# Patient Record
Sex: Female | Born: 1990 | Race: Black or African American | Hispanic: No | Marital: Married | Smoking: Never smoker
Health system: Southern US, Community
[De-identification: ages and names within clinical notes are randomized; demographics above are authoritative.]

## PROBLEM LIST (undated history)

## (undated) ENCOUNTER — Inpatient Hospital Stay (HOSPITAL_COMMUNITY): Payer: Self-pay

## (undated) DIAGNOSIS — R896 Abnormal cytological findings in specimens from other organs, systems and tissues: Principal | ICD-10-CM

## (undated) DIAGNOSIS — R87629 Unspecified abnormal cytological findings in specimens from vagina: Secondary | ICD-10-CM

## (undated) DIAGNOSIS — Z8619 Personal history of other infectious and parasitic diseases: Secondary | ICD-10-CM

## (undated) DIAGNOSIS — M92529 Juvenile osteochondrosis of tibia tubercle, unspecified leg: Secondary | ICD-10-CM

## (undated) DIAGNOSIS — M9250 Juvenile osteochondrosis of tibia and fibula, unspecified leg: Secondary | ICD-10-CM

## (undated) DIAGNOSIS — A749 Chlamydial infection, unspecified: Secondary | ICD-10-CM

## (undated) HISTORY — DX: Chlamydial infection, unspecified: A74.9

## (undated) HISTORY — PX: NO PAST SURGERIES: SHX2092

## (undated) HISTORY — DX: Juvenile osteochondrosis of tibia tubercle, unspecified leg: M92.529

## (undated) HISTORY — DX: Personal history of other infectious and parasitic diseases: Z86.19

## (undated) HISTORY — DX: Abnormal cytological findings in specimens from other organs, systems and tissues: R89.6

## (undated) HISTORY — DX: Juvenile osteochondrosis of tibia and fibula, unspecified leg: M92.50

---
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
## 2009-01-24 ENCOUNTER — Ambulatory Visit: Payer: Self-pay | Admitting: Internal Medicine

## 2009-02-28 ENCOUNTER — Ambulatory Visit: Payer: Self-pay | Admitting: Internal Medicine

## 2009-02-28 DIAGNOSIS — H9209 Otalgia, unspecified ear: Secondary | ICD-10-CM | POA: Insufficient documentation

## 2009-02-28 DIAGNOSIS — M79609 Pain in unspecified limb: Secondary | ICD-10-CM

## 2009-03-02 LAB — CONVERTED CEMR LAB
Chlamydia, DNA Probe: NEGATIVE
GC Probe Amp, Genital: NEGATIVE

## 2009-04-18 ENCOUNTER — Ambulatory Visit: Payer: Self-pay | Admitting: Internal Medicine

## 2009-07-10 ENCOUNTER — Ambulatory Visit: Payer: Self-pay | Admitting: Internal Medicine

## 2009-09-29 ENCOUNTER — Ambulatory Visit: Payer: Self-pay | Admitting: Internal Medicine

## 2009-12-20 ENCOUNTER — Ambulatory Visit: Payer: Self-pay | Admitting: Internal Medicine

## 2009-12-20 LAB — CONVERTED CEMR LAB: Hemoglobin: 13 g/dL

## 2009-12-22 ENCOUNTER — Encounter: Payer: Self-pay | Admitting: *Deleted

## 2009-12-22 LAB — CONVERTED CEMR LAB
Chlamydia, Swab/Urine, PCR: NEGATIVE
GC Probe Amp, Urine: NEGATIVE

## 2009-12-23 DIAGNOSIS — K219 Gastro-esophageal reflux disease without esophagitis: Secondary | ICD-10-CM | POA: Insufficient documentation

## 2009-12-23 DIAGNOSIS — J309 Allergic rhinitis, unspecified: Secondary | ICD-10-CM | POA: Insufficient documentation

## 2010-03-15 ENCOUNTER — Encounter: Payer: Self-pay | Admitting: *Deleted

## 2010-03-16 ENCOUNTER — Ambulatory Visit: Payer: Self-pay | Admitting: Internal Medicine

## 2010-05-23 ENCOUNTER — Emergency Department (HOSPITAL_COMMUNITY): Admission: EM | Admit: 2010-05-23 | Discharge: 2010-05-23 | Payer: Self-pay | Admitting: Emergency Medicine

## 2010-05-25 ENCOUNTER — Encounter: Payer: Self-pay | Admitting: Internal Medicine

## 2010-05-25 ENCOUNTER — Ambulatory Visit: Payer: Self-pay | Admitting: Internal Medicine

## 2010-05-25 ENCOUNTER — Telehealth: Payer: Self-pay | Admitting: Internal Medicine

## 2010-05-25 DIAGNOSIS — R079 Chest pain, unspecified: Secondary | ICD-10-CM

## 2010-05-29 ENCOUNTER — Ambulatory Visit: Payer: Self-pay | Admitting: Internal Medicine

## 2010-06-07 ENCOUNTER — Ambulatory Visit: Payer: Self-pay | Admitting: Internal Medicine

## 2010-07-26 ENCOUNTER — Ambulatory Visit: Payer: Self-pay | Admitting: Internal Medicine

## 2010-07-26 DIAGNOSIS — R109 Unspecified abdominal pain: Secondary | ICD-10-CM | POA: Insufficient documentation

## 2010-07-26 DIAGNOSIS — R197 Diarrhea, unspecified: Secondary | ICD-10-CM

## 2010-07-31 ENCOUNTER — Encounter: Payer: Self-pay | Admitting: Internal Medicine

## 2010-08-01 ENCOUNTER — Encounter: Payer: Self-pay | Admitting: Internal Medicine

## 2010-08-29 ENCOUNTER — Ambulatory Visit
Admission: RE | Admit: 2010-08-29 | Discharge: 2010-08-29 | Payer: Self-pay | Source: Home / Self Care | Attending: Internal Medicine | Admitting: Internal Medicine

## 2010-09-11 ENCOUNTER — Telehealth: Payer: Self-pay | Admitting: Internal Medicine

## 2010-09-11 NOTE — Assessment & Plan Note (Signed)
Summary: CPX/DEPO-INJ/RCD   Vital Signs:  Patient profile:   20 year old female Menstrual status:  Depo Height:      63.75 inches Weight:      124 pounds Pulse rate:   78 / minute BP sitting:   110 / 60  (right arm) Cuff size:   regular  Vitals Entered By: Romualdo Bolk, CMA (AAMA) (Dec 20, 2009 11:00 AM) CC: CPX with pap and Depo   History of Present Illness: Christine Rose comes in comes in today  for  preventive visit . Since  last visit no sig change in health  but concern about  gets achiness  around her heart   for a while . Worse after eating  and laying down sometiimes . NOT related to activity walking, breathing .  no cough or change in exercise tolerance. Risk:  depo doing okNo change partners  or other increase risk . NO GU signs   Preventive Care Screening  Last Tetanus Booster:    Date:  05/22/2006    Results:  Tdap    Preventive Screening-Counseling & Management  Alcohol-Tobacco     Alcohol drinks/day: 0     Smoking Status: never  Caffeine-Diet-Exercise     Caffeine use/day: 1-2     Does Patient Exercise: no  Hep-HIV-STD-Contraception     Sun Exposure-Excessive: no  Safety-Violence-Falls     Seat Belt Use: yes     Smoke Detectors: yes      Sexual History:  currently monogamous.        Drug Use:  never.        Blood Transfusions:  no.    Current Medications (verified): 1)  Depo-Provera 150 Mg/ml Im Susp (Medroxyprogesterone Acetate)  Allergies (verified): No Known Drug Allergies  Past History:  Past medical, surgical, family and social histories (including risk factors) reviewed, and no changes noted (except as noted below).  Past Medical History: NL birth Hx  6 4 oz 37 weeks Chicken pox disease Hx Osgood schlatters Pos chlamydia screen 2 10  Last Pap: 2/10 Tdap: 2007  seasonal allergy  .   Allergic rhinitis  Past History:  Care Management: None Current  Family History: Reviewed history from 06/22/2008 and no changes  required. no blood clots Mom has large ASD   declining  surgical  i ntervention  Social History: Reviewed history from 10/05/2008 and no changes required. non smoker   no alcohol hh of 5    no ets.  little  milk  no exercise .  School  [employer].   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 sleep less than 8 hours during week   .  8 hours Seat Belt Use:  yes Sun Exposure-Excessive:  no Sexual History:  currently monogamous Drug Use:  never Blood Transfusions:  no  Review of Systems  The patient denies anorexia, fever, weight loss, weight gain, vision loss, decreased hearing, hoarseness, syncope, dyspnea on exertion, peripheral edema, prolonged cough, abdominal pain, melena, hematochezia, severe indigestion/heartburn, hematuria, incontinence, genital sores, muscle weakness, transient blindness, difficulty walking, depression, unusual weight change, abnormal bleeding, enlarged lymph nodes, and angioedema.         under eye has black circldes   Physical Exam General Appearance: well developed, well nourished, no acute distress Eyes: conjunctiva and lids normal, PERRLA, EOMI, WNL  allergic shiners seen  Ears, Nose,  Mouth, Throat: TM clear, nares clear, oral exam WNL Neck: supple, no lymphadenopathy, no thyromegaly, no JVD Respiratory: clear to auscultation and percussion, respiratory effort normal Cardiovascular: regular rate and rhythm, S1-S2, no murmur, rub or gallop, no bruits, peripheral pulses normal and symmetric, no cyanosis, clubbing, edema or varicosities Chest: no scars, masses, tenderness; no asymmetry, skin changes, nipple discharge   Gastrointestinal: soft, non-tender; no hepatosplenomegaly, masses; active bowel sounds all quadrants,  Genitourinary: NI Lymphatic: no cervical, axillary or inguinal adenopathy Musculoskeletal: gait normal, muscle tone and strength WNL, no joint swelling, effusions,  discoloration, crepitus  Skin: clear, good turgor, color WNL, no rashes, lesions, or ulcerations Neurologic: normal mental status, normal reflexes, normal strength, sensation, and motion Psychiatric: alert; oriented to person, place and time Other Exam:     Impression & Recommendations:  Problem # 1:  PREVENTIVE HEALTH CARE (ICD-V70.0) Discussed nutrition,exercise,diet,healthy weight, vitamin D and calcium.  Orders: Fingerstick (36416) Hgb (24401)  Problem # 2:  CONTRACEPTIVE MANAGEMENT (ICD-V25.09) continue  Orders: Depo-Provera 150mg  (U2725)  Problem # 3:  GERD (ICD-530.81) chest signs seem like reflux disease  and no evidence of cv pulm process at present  .Marland Kitchen..counseled lifestyle intervention and add meds   declines  medication intervention at this time  Problem # 4:  ALLERGIC RHINITIS (ICD-477.9) adding to fatigue and facial dark circles   . counseled  decline nasal steroid   Complete Medication List: 1)  Depo-provera 150 Mg/ml Im Susp (Medroxyprogesterone acetate)  Other Orders: Admin of Therapeutic Inj  intramuscular or subcutaneous (36644) T-Chlamydia & GC Probe, Urine (87491/[id number])  Patient Instructions: 1)  Next Depo Due Aug 3rd 2)  Small frequent meals  and avoid  caffiene and  laying down after eating to help woth the reflux signs .Can add ranitidine  or pepcid  if not getting better every day for 2 weeks  .    If persistent or  progressive  then call of rov. 3)  For allergy and try OTC loratidine  , zyrtec ( certizine ) or allergra to help with  allergy signs  4)  Better sleep and treating the allergies  will help the dark circles under your eyes also. 5)  You will be informed of lab results when available.    Medication Administration  Injection # 1:    Medication: Depo-Provera 150mg     Diagnosis: CONTRACEPTIVE MANAGEMENT (ICD-V25.09)    Route: IM    Site: RUOQ gluteus    Exp Date: 06/12/2012    Lot #: [id number]    Mfr: greenstone    Patient  tolerated injection without complications    Given by: Romualdo Bolk, CMA (AAMA) (Dec 20, 2009 11:10 AM)  Orders Added: 1)  Depo-Provera 150mg  [J1055] 2)  Admin of Therapeutic Inj  intramuscular or subcutaneous [96372] 3)  Fingerstick [36416] 4)  Hgb [85018] 5)  T-Chlamydia & GC Probe, Urine [87491/[id number]] 6)  Est. Patient 18-39 years [99395] 7)  Est. Patient Level III [99213]   Laboratory Results   CBC   HGB:  13.0 g/dL   (Normal Range: 59.5-63.8 in Males, 12.0-15.0 in Females) CommentsRita Ohara  Dec 20, 2009 12:06 PM

## 2010-09-11 NOTE — Progress Notes (Signed)
Summary: ED Report   Christine Rose, Christine Rose MRN: [id number] Acct#: [id number] PHYSICIAN DOCUMENTATION SHEET Fri Oct 14 08:51:00 EDT 2011 Eligha Bridegroom. [facility] [address] [place], [place] [address] PHONE: [phone] MRN: [id number] Account #: [id number] Name: Christine, Rose Sex: F Age: 20 DOB: 03-06-1991 Complaint: Chest discomfort Primary Diagnosis: Chest pain Arrival Time: 05/23/2010 08:53 Discharge Time: 05/23/2010 14:09 All Providers: Dr. Cheri Guppy, MD PROVIDER: Dr. Cheri Guppy, MD HPI: The patient is a 20 year old female who presents with a chief complaint of chest discomfort. The history was provided by the patient. she c/o intermittent sharp midsternal cp. the pain lasts only a few minutes at a time. she denies cough, fever,chills, n/v/leg pain or swelling. she is on depo. she denies recent trip or surgery. she denies smoking. she denies cp if she goes up stairs at home. she has no pain now. she says her mother had an ami when she was in her 30s. The chest discomfort started yesterday. The onset was insidious. The Pattern is episodic. The patient has had several episode(s). The Course is resolved. The chest discomfort is located in the substernal region. The chest discomfort has no radiation. It is characterized as sharp. The patient's pain is 0 out of 10 now. The condition is aggravated by nothing and not aggravated by coughing, deep breathing, eating, exercise, exertion, lying on back, movement or palpation. The condition is relieved by nothing. There has been no associated abdominal pain, chills, cough, diaphoresis, dizziness, dysphagia, dyspnea, fever, nausea, palpitations or syncope. The patient's relevant risk factors are positive for family history of premature CAD and negative for diabetes, hypertension, recent surgery, recent travel or smoking. The patient has no significant history of serious medical conditions, similar symptoms  previously or multiple prior evals for same. 11:15 05/23/2010 by Cheri Guppy, MD, Dr. Linus Orn: Statement: all systems negative except as marked or noted in the HPI 11:15 05/23/2010 by Cheri Guppy, MD, Dr. [facility]: Documentation: physician reviewed/amended Historian: patient Patient's Current Physicians Patient's Current Physicians (please list PCP first) [facility], - General Internal Medicine Past medical history: none Family History: CAD Surgical History: none 1 Christine, Rose MRN: [id number] Acct#: [id number] Social History: non-smoker, non-drinker, no drug abuse Allergies Drug Reaction Allergy Note NKDA 11:15 05/23/2010 by Cheri Guppy, MD, Dr. Joseph Pierini Medications: Documentation: physician reviewed/amended Medications Medication [Medication] Dosage Frequency Last Dose Denies 11:15 05/23/2010 by Cheri Guppy, MD, Dr. Physical examination: Vital signs and O2 SAT: reviewed Constitutional: well developed, well nourished, in no acute distress Head and Face: normocephalic, atraumatic Eyes: normal appearance, no scleral icterus Neck: full range of motion Cardiovascular: regular rate and rhythm, no murmur, rub, or gallop Respiratory: normal, no rales, rhonchi, wheezes, or rub Chest: nontender Abdomen: nontender, nondistended, soft Extremities: normal, no abnormalities, no deformity, full range of motion, neurovascularly intact, pulses normal, no tenderness, no edema Neuro: AA&Ox3, motor intact in all extremities Skin: color normal, no rash, dry Psychiatric: AA&Ox4, no abnormalities of mood or affect 11:15 05/23/2010 by Cheri Guppy, MD, Dr. ED Course: Comments: H&P performed - intitiated evaluation with xr 11:15 05/23/2010 by Cheri Guppy, MD, Dr. Imaging: Radiology Study Interpretation Comments Timestamp xray(s) per radiologist, reviewed by myself, normal 13:36 05/23/2010 by Cheri Guppy, MD, Dr. Patient  disposition: Patient disposition: Disch - Home 2 [name], [name] - MRN: [id number] Acct#: [id number] Primary Diagnosis: chest pain Counseling: advised of diagnosis, advised of treatment plan, advised of xray and lab findings, advised of need for close follow-up, advised of need  to return for worsening or changing symptoms, advised of specific symptoms that should prompt their return 13:36 05/23/2010 by Cheri Guppy, MD, Dr. Medication disposition: Medications Medication [Medication] Dosage Frequency Last Dose Medication disposition PCP contact Denies continue 13:36 05/23/2010 by Cheri Guppy, MD, Dr. Discharge: Discharge Instructions: *free text Append a Note to Discharge Instructions: Your ecg and chest xray do not show any signs of a heart attack or lung disease. Use ibuprofen or tylenol for pain. Follow up with your doctor for reevaluation if your symptoms persist. Return for worse symptoms. 13:37 05/23/2010 by Cheri Guppy, MD, Dr. Milinda Pointer electronically signed by Responsible Physician 13:38 05/23/2010 by Cheri Guppy, MD, Dr. Milinda Pointer electronically signed by Responsible Physician 13:41 05/23/2010 by Cheri Guppy, MD, Dr. Reviewed result: Result Type: Cleda Daub: [id number] Step Type: XRAY Procedure Name: DG CHEST 2 VIEW Procedure: DG CHEST 2 VIEW Result: Clinical Data: Chest pain. CHEST - 2 VIEW Comparison: None. Findings: Normal sized heart. Clear lungs with normal vascularity. Normal appearing bones. IMPRESSION: 3 [name], [name] MRN: [id number] Acct#: [id number] Normal examination. 13:38 05/23/2010 by Cheri Guppy, MD, Dr. Cardiac: EKG EKG Time Rate Rhythm Axis PQRS & Intervals ST & T 08:58 PM 71 BPM normal sinus, premature atrial contractions normal normal QRS normal 13:41 05/23/2010 by Cheri Guppy, MD, Dr. 4      [place]. [facility] [address] [place], [place]  [address] Emergency Department [phone] Assessment Sheet MR # [id number] Sex: Female DOB: 01-29-1991 Name: Christine, Rose Phone: [phone] Address: [address] [place], [place] [address] Room: RM30 Account # [id number] Age: 59 Complaint: Chest pain Acuity: 3 - Urgent Arrival Date/Time: 08:53 05/23/2010 Insurance: [employer] Arrived by: Sales executive Amb/Helicopter: Accompanied By: Referring Facility: Mobility: EDP / T/S Attending: Weldon Inches, MD, Hassan Buckler Primary Care: [facility] ED T/S: PA/NP/CNM: Gentry Fitz, Vital Signs Inits Time Temp Blood Pressure Pulse Resp IG 09:02 98.5 O 122/83 Automatic,Lying,Left Arm 69 16 JG 09:50 115/70 Automatic,Lying,Right Arm 61 JG 11:49 98.1 107/68 Automatic,Lying,Left Arm 59 TB 14:08 Pulse Oximetry Time % 09:02 100 09:50 100 11:49 100 14:08 [place] Coma Time Score Complaint Code: Chest Pain Resident / T/S Montez Hageman. Faculty: Jerrye Bushy Pregnant: Private PA/NP: Gentry Fitz, Triage Nurse: Chilton Si - RN, Marylu Lund Consultant on Call: [facility] on Call: Weights: Treatment PTA: Disposition Information Primary Diagnosis: Chest pain Other Diagnoses: Disposition: Disch - Home Family Notification: Report Called By: Report Given To: Prescriptions: Discharge Instructions: *free text -- {Documentation truncated, see the full med record.} Disability Statement: Follow-up Care: Appt Date/Time: Discharge Time: 14:09 05/23/2010 Discharge Nurse: Jaci Lazier - RN, Traci Discharge Nurse: ____________________________________________ PMH (Most Recent) - 05/23/2010 11:16 - Cheri Guppy, MD, Dr. Past Medical Hx: None Immunization Hx: Social History: No drug abuse, Non-drinker, Non-smoker Family History: CAD LMP: Other Menstual Hx: OB/GYN History: Special Needs: Note: Pupils Time L(mm) R(mm) Pain Time Scale 08:59 5/10 14:09 0/10 Medications Medication Dosage Freq Medication Dosage  Freq Denies Allergy Allergy Allergic Reaction NKDA  Triage/Initial Assessment 08:59 05/23/2010 - Initial Triage Info -- Dorice Lamas - RN Quick Assessment: AIRWAY handling secretions, AIRWAY intact, AMBULATES - without assistance, BEHAVIOR tearful, BEHAVIOR cooperative, BREATH SOUNDS - clear & equal Isolation Assessment: Assmt completed-no isolation needed Chief Complaint: Chest pain Complaint Category: Chest Pain Initial Triage Acuity: 3 - Urgent Presenting Complaints: Chest Pain Note: pt here with chest pain. pt has no previous hx of chest pain. pt tearful in triage. pt rates pain 5/10 XRAY 13:35 05/23/2010 - Final Order Results -- Interface OBSERVATION  DATE/TIME: 05/23/2010 13:26 Note: Procedure: DG CHEST 2 VIEW Result: Clinical Data: Chest pain. CHEST - 2 VIEW Comparison: None. Findings: Normal sized heart. Clear lungs with normal vascularity. Normal appearing bones. IMPRESSION: Normal examination. Assessment 08:54 05/23/2010 - Change Room -- Andrew Au - REG Change Room: Triage Waiting Area 08:54 05/23/2010 - Change Physician -- Andrew Au - REG EDP / T/S Attending: Gentry Fitz, - Prim. Care Provider: [facility], - Resident / T/S Montez Hageman. Faculty: Gentry Fitz, - ED PA/NP: Gentry Fitz, - Private: PA/NP/CNM: Gentry Fitz - Responsible Physician: Gentry Fitz, - 08:56 05/23/2010 - Change Room -- Dorice Lamas - RN Change Room: EKG Room 08:58 05/23/2010 - Fall Risk Assessment -- Dorice Lamas - RN Low fall risk because: Ambulatory, steady gait, Independent and continent, No hx of falls, No orthostasis 08:59 05/23/2010 - Home Medications -- Dorice Lamas - RN Med: Denies 08:59 05/23/2010 - Pain -- Dorice Lamas - RN Pain Severity: 5/10 Pain Type: Constant, Chest pain Location: chest, diffuse Objective pain assessment: Grimacing, Crying Pain Goal: 0/10 09:00 05/23/2010 - Allergy Information -- Dorice Lamas - RN Allergy: NKDA 09:01 05/23/2010 - Change Room -- Dorice Lamas - RN Change Room: Exam Room 30 09:02 05/23/2010 - Vital Signs -- Janet Berlin - NT Temp: 98.5 Oral BP: 122/83, Automatic, Lying, Left Arm, Regular cuff HR: 69, Regular Resp: 16, N/A Vital Signs Reporting: Normal VS / Non Critical Abnormal VS 09:02 05/23/2010 - Oximetry -- Janet Berlin - NT Pulse Oximetry %: 100 Oxygen Therapy: Room Air 09:06 05/23/2010 - Change Physician -- Cheri Guppy, MD, Dr. Francene Castle / T/S Attending: Weldon Inches, MD, Hassan Buckler - Prim. Care Provider: [facility], - Resident / T/S Montez Hageman. Faculty: Gentry Fitz, - ED PA/NP: Gentry Fitz, - Private: PA/NP/CNM: Gentry Fitz - Responsible Physician: Weldon Inches, MD, Hassan Buckler - 09:39 05/23/2010 - Property Inventory -- Clayburn Pert - RN Valuables Disposition: Remains with patient (list all items) 09:41 05/23/2010 - Primary Survey -- Clayburn Pert - RN Airway: Intact Breathing: Present Circulation: Adequate Breath Sounds - Left: Breath sounds - Clear Breath Sounds - Right: Breath sounds - Clear RUE Strength: Normal RLE Strength: Normal LUE Strength: Normal LLE Strength: Normal Presenting Complaints: Chest Pain (non-cardiac) 09:50 05/23/2010 - Musculoskeletal/Extremity -- Clayburn Pert - RN Historian: patient Symptom: Bone pain Note: Pt here with c/o sharp center chest pain that started last night. Pt reports that is intermittent andn is pain free at this time. No n/v, no sob, no diaphoresis. 09:50 05/23/2010 - Pt. Safety/Nursing Rounds -- Clayburn Pert - RN Indications: Nursing rounds Education/Consent: Barriers to learning assessed Process/Procedure: Bed rails up, Call light within reach, Comfort measures, Introduced self to patient/family, Stretcher locked in lowest position Post procedure assessment: Alert & oriented x 3, Patient comfortable, Patient resting, Skin warm and dry, Vital signs WNL Performed by: Pernell Dupre - RN, Erin 09:50 05/23/2010 - Oximetry -- Ottis Stain - NT Pulse Oximetry %: 100 Oxygen Therapy:  Room Air 09:50 05/23/2010 - Vital Signs -- Ottis Stain - NT Temp: BP: 115/70, Automatic, Lying, Right Arm, Regular cuff HR: 61 Vital Signs Reporting: Normal VS / Non Critical Abnormal VS 11:16 05/23/2010 - HPI -- Cheri Guppy, MD, Dr. Note: The patient is a 20 year old female who presents with a chief complaint of chest discomfort. The history was provided by the patient. she c/o intermittent sharp midsternal cp. the pain lasts only a few minutes at a time. she denies cough, fever,chills, n/v/leg pain or swelling. she is on depo. she denies recent trip or surgery. she denies smoking. she denies cp if she  goes up stairs at home. she has no pain now. she says her mother had an ami when she was in her 30s. The chest discomfort started yesterday. The onset was insidious. The Pattern is episodic. The patient has had several episode(s). The Course is resolved. The chest discomfort is located in the substernal region. The chest discomfort has no radiation. It is characterized as sharp. The patient's pain is 0 out of 10 now. The condition is aggravated by nothing and not aggravated by coughing, deep breathing, eating, exercise, exertion, lying on back, movement or palpation. The condition is relieved by nothing. There has been no associated abdominal pain, chills, cough, diaphoresis, dizziness, dysphagia, dyspnea, fever, nausea, palpitations or syncope. The patient's relevant risk factors are positive for family history of premature CAD and negative for diabetes, hypertension, recent surgery, recent travel or smoking. The patient has no significant history of serious medical conditions, similar symptoms previously or multiple prior evals for same. 11:16 05/23/2010 - Allergy Information -- Cheri Guppy, MD, Dr. Titus Mould: NKDA 11:16 05/23/2010 - Home Medications -- Cheri Guppy, MD, Dr. Med: Denies 11:16 05/23/2010 - Physical Examination -- Cheri Guppy, MD, Dr. Sheliah Hatch EDIS:  assessment_sheet Page 3 of 3 Friday - May 25, 2010 - 08:51 [facility] [phone] Assessment Sheet MR # [id number] Sex: Female DOB: 1991-08-02 Name: Ladajah, Soltys Phone: [phone] Address: [address] [place], [place] [address] Room: RM30 Account # [id number] Age: 37 [place]. [facility] [address] [place], [place] [address] Vital signs and O2 SAT: Reviewed Constitutional: Well developed, Well nourished, In no acute distress Head and Face: Normocephalic, Atraumatic Eyes: Normal appearance, No scleral icterus Neck: Full range of motion Cardiovascular: Regular rate and rhythm, No murmur, rub, or gallop Respiratory: Normal, No rales, rhonchi, wheezes, or rub Chest: Nontender Abdomen: Nontender, Nondistended, Soft Extremities: Normal, No abnormalities, No deformity, Full range of motion, Neurovascularly intact, Pulses normal, No tenderness, No edema Neuro: AA&Ox3, Motor intact in all extremities Skin: Color normal, No rash, Dry Psychiatric: AA&Ox4, No abnormalities of mood or affect 11:49 05/23/2010 - Vital Signs -- Ottis Stain - NT Temp: 98.1 BP: 107/68, Automatic, Lying, Left Arm, Regular cuff HR: 59 Vital Signs Reporting: Normal VS / Non Critical Abnormal VS 11:49 05/23/2010 - Oximetry -- Ottis Stain - NT Pulse Oximetry %: 100 Oxygen Therapy: Room Air 13:14 05/23/2010 - Hand Off / Margo Aye Pass Info -- Lance Muss - Sec Code Status: FullCode Isolation: None Diet: NPO Safety Risk(s): Cooperative Nurse Contact Number: erin [phone] Monitor Status: May transport without monitor 13:14 05/23/2010 - Group Orders -- Lance Muss - Sec Completed Group Orders Step: Ready - Transport to Xray Completed Group Orders Type: XRAY Completed Grouped Orders: Chest PA & Lat - Ready - Transport to Xray 13:36 05/23/2010 - Discharge Diagnosis -- Cheri Guppy, MD, Dr. Valera Castle: Chest pain 13:36 05/23/2010 - Home Medications --  Cheri Guppy, MD, Dr. Med: Denies Disposition: Continue 14:08 05/23/2010 - Vital Signs -- Dahlia Byes - RN Temp: Vital Signs Reporting: Normal VS / Non Critical Abnormal VS 14:09 05/23/2010 - CDR Request -- Interface Chart sent to CDR: PDF sent to CDR2 14:09 05/23/2010 - Discharge Condition -- Dahlia Byes - RN Acuity: 3 - Urgent Patient Teaching: F/U plan of care reviewed w/ pt/family, Pt voiced understanding of plan of care, Written dx instructions reviewed. signed Pain Severity: 0/10 14:09 05/23/2010 - Screening -- Dahlia Byes - RN [facility]. being hurt at [facility]: No Barriers to learning: No Chg. in ability to  care for self: No Cult/Rel beliefs we need to know: No Current relationship-feel unsafe: No Feel physical threat from others: No Fall/Safety-Risk to Self/Others: No Lives alone: No Requires assistance with bathing: No Requires assistance with eating: No Requires assistance with meds: No Requires assistance w/ mobility: No Unintentional wt loss past 3 mon: No 14:09 05/23/2010 - CDR Request -- Interface Chart sent to CDR: Chart sent to CDR: Wed Oct 12 14:09:39 EDT 2011 Result: OK 08:49 05/25/2010 - Return to work/school -- Sheralyn Boatman -RN Ambrose Mantle In ED From: 05/23/2010 08:53 Until: 05/23/2010 14:09 Return dispo: May return to work without restrictions Return Date: 05/26/2010 08:50 05/25/2010 - CDR Request -- Interface Chart sent to CDR: PDF sent to CDR2 Orders 08:54 05/23/2010 - Fall Risk Assessment -- Andrew Au - REG 08:54 05/23/2010 - Property Inventory -- Andrew Au - REG 08:54 05/23/2010 - Initial Patient Orders -- Andrew Au - REG 09:36 05/23/2010 - ECG -- Dorice Lamas - RN 09:51 05/23/2010 - Chest PA & Lat -- Cheri Guppy, MD, Dr. 09:51 05/23/2010 - Rhythm Strip, Monitor -- Cheri Guppy, MD, Dr. 16:10 05/23/2010 - Check on Imaging Delay -- Cheri Guppy, MD, Dr. Allean Found 05/23/2010 - Check on Imaging Delay -- Cheri Guppy, MD, Dr. 13:36  05/23/2010 - Disch - Home -- Cheri Guppy, MD, Dr. Review Charges - Dahlia Byes - RN at 05/23/2010 14:09

## 2010-09-11 NOTE — Assessment & Plan Note (Signed)
Summary: depo shot//ccm  Nurse Visit   Vital Signs:  Patient profile:   19 year old female Menstrual status:  Depo Weight:      121 pounds Pulse rate:   60 / minute BP sitting:   100 / 60  (right arm) Cuff size:   regular  Vitals Entered By: Romualdo Bolk, CMA (AAMA) (September 29, 2009 8:40 AM) CC: Pt states no side effects or problems on depo.   Allergies: No Known Drug Allergies   Patient Instructions: 1)  Next Depo Due May 12 with CPX   Medication Administration  Injection # 1:    Medication: Depo-Provera 150mg     Diagnosis: CONTRACEPTIVE MANAGEMENT (ICD-V25.09)    Route: IM    Site: RUOQ gluteus    Exp Date: 12/11/2011    Lot #: [id number]    Mfr: [email or web address]    Patient tolerated injection without complications    Given by: Romualdo Bolk, CMA (AAMA) (September 29, 2009 8:39 AM)  Orders Added: 1)  Depo-Provera 150mg  [J1055] 2)  Admin of Therapeutic Inj  intramuscular or subcutaneous [95621]

## 2010-09-11 NOTE — Letter (Signed)
Summary: Out of Work  Adult nurse at [employer]  [address]   [place], [place] [address]   Phone: [phone]  Fax: [phone]    May 29, 2010   Employee:  Christine Rose    To Whom It May Concern:   For Medical reasons, please excuse the above named employee from work for the following dates:  Start:   October 17th  End:   October 24 th   If you need additional information, please feel free to contact our office.         Sincerely,    Madelin Headings MD

## 2010-09-11 NOTE — Assessment & Plan Note (Signed)
Summary: depo inj/njr  Nurse Visit   Vital Signs:  Patient profile:   20 year old female Menstrual status:  Depo Weight:      125 pounds Pulse rate:   72 / minute BP sitting:   100 / 62  (left arm) Cuff size:   regular  Vitals Entered By: Romualdo Bolk, CMA (AAMA) (June 07, 2010 11:09 AM) CC: Depo Inj- No side effects, no period, ha's, wt gain or spotting.    Patient Instructions: 1)  Next Depo due 08/29/2010   Current Medications (verified): 1)  Depo-Provera 150 Mg/ml Im Susp (Medroxyprogesterone Acetate)  Allergies (verified): No Known Drug Allergies  Medication Administration  Injection # 1:    Medication: Depo-Provera 150mg     Diagnosis: CONTRACEPTIVE MANAGEMENT (ICD-V25.09)    Route: IM    Site: RUOQ gluteus    Exp Date: 07/12/2012    Lot #: [id number]    Mfr: [email or web address]    Patient tolerated injection without complications    Given by: Romualdo Bolk, CMA (AAMA) (June 07, 2010 11:11 AM)  Orders Added: 1)  Depo-Provera 150mg  [J1055] 2)  Admin of Therapeutic Inj  intramuscular or subcutaneous [65784]

## 2010-09-11 NOTE — Assessment & Plan Note (Signed)
Summary: fu per pt/njr   Vital Signs:  Patient profile:   20 year old female Menstrual status:  Depo Height:      63.75 inches (161.93 cm) Weight:      120 pounds (54.55 kg) O2 Sat:      98 % on Room air Temp:     98.5 degrees F (36.94 degrees C) oral Pulse rate:   100 / minute BP sitting:   102 / 70  (left arm) Cuff size:   regular  Vitals Entered By: Josph Macho RMA (May 29, 2010 9:55 AM)  O2 Flow:  Room air CC: Follow-up visit on chest pain- pt states she feels better/ CF   History of Present Illness: Christine Rose comes in today   for above.  wit mom today.  SInce her last visit her chest pain is better but there at times with some motions.  poss reated to stress.  never had rash and didnt takt  medication f or shingles.  No sob or fever.    No new signs   Preventive Screening-Counseling & Management  Alcohol-Tobacco     Alcohol drinks/day: 0     Smoking Status: never  Caffeine-Diet-Exercise     Caffeine use/day: 1-2     Does Patient Exercise: no  Current Medications (verified): 1)  Depo-Provera 150 Mg/ml Im Susp (Medroxyprogesterone Acetate)  Allergies (verified): No Known Drug Allergies  Past History:  Past medical, surgical, family and social histories (including risk factors) reviewed, and no changes noted (except as noted below).  Past Medical History: Reviewed history from 12/20/2009 and no changes required. NL birth Hx  6 4 oz 37 weeks Chicken pox disease Hx O[facility] schlat[facility] Pos chlamydia screen 2 10  Last Pap: 2/10 Tdap: 2007  seasonal allergy  .   Allergic rhinitis  Family History: Reviewed history from 12/20/2009 and no changes required. no blood clots Mom has large ASD   declining  surgical  i ntervention  Social History: Reviewed history from 12/20/2009 and no changes required. non smoker   no alcohol hh of 5    no ets.  little  milk  no exercise .  School  [employer].   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 sleep less than 8 hours during week   .  8 hours   Review of Systems  The patient denies anorexia, fever, weight loss, weight gain, vision loss, decreased hearing, hoarseness, syncope, peripheral edema, prolonged cough, abdominal pain, melena, hematochezia, severe indigestion/heartburn, abnormal bleeding, and enlarged lymph nodes.         tends to be quiet and hold stress in   Physical Exam  General:  Well-developed,well-nourished,in no acute distress; alert,appropriate and cooperative throughout examination looks more rested  Head:  normocephalic and atraumatic.   Neck:  No deformities, masses, or tenderness noted. Chest Wall:  slight tendernes  later to sternum on left   no creitus  Breasts:  norash Lungs:  Normal respiratory effort, chest expands symmetrically. Lungs are clear to auscultation, no crackles or wheezes. Heart:  Normal rate and regular rhythm. S1 and S2 normal without gallop, murmur, click, rub or other extra sounds. Pulses:  pulses intact without delay   Neurologic:  non focal Skin:  no rashes, no ecchymoses, and no petechiae.   Cervical Nodes:  No lymphadenopathy noted Psych:  Oriented X3, normally interactive, good  eye contact, not anxious appearing, and not depressed appearing.  quiet more relaxed   Impression & Recommendations:  Problem # 1:  CHEST PAIN, ACUTE (ICD-786.50) Assessment Improved probably chest wall  poss job related   and stress anxiety over this   Problem # 2:  OTHER PSYCHOLOGICAL OR PHYSICAL STRESS NEC OTHER (ICD-V62.89) disc  and good supports   Complete Medication List: 1)  Depo-provera 150 Mg/ml Im Susp (Medroxyprogesterone acetate)  Patient Instructions: 1)  ok to go back to work but  and increase activity  .. still could have pain with certain movements .     note for work and Expectant management  2)  follow up at check up or next edepo    Orders Added: 1)  Est. Patient  Level III [91478]

## 2010-09-11 NOTE — Assessment & Plan Note (Signed)
Summary: pts been having pains around chest for approx/week/cjr---PT R...   Vital Signs:  Patient profile:   20 year old female Menstrual status:  Depo Height:      63.75 inches Weight:      123 pounds BMI:     21.36 O2 Sat:      99 % Temp:     99.2 degrees F oral Pulse rate:   66 / minute BP sitting:   100 / 60  (right arm) Cuff size:   regular  Vitals Entered By: Romualdo Bolk, CMA (AAMA) (May 25, 2010 11:17 AM) CC: Pt is having chest pains that started on 10/11. Pain are in the center of chest. Pt is did have one time where she was short of breathe. Rt arm hurts then she would have pain in the center of her stomach. Pt states that it goes from her chest down. No burning in her throat.   History of Present Illness: Christine Rose comes in today  for acute onset  of med sharp chest pain when laying down to go to sleep  2 days ago.  recurred at work  and was taken to  [facility] and had evaluation  including EKG and chest x ray , October 12 and  saw no  signs of Heart attack.  NO meds. Since then   continual and waxes and wanes   doesnt wake from sleep.   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    Worse with soda  and "not going down. "   Perosn at work said her Bp was 170 and pulse  127. She comes in today for evaluation.  No sob except when pain inc no cough fever  NVD and no trauma. NO hx of same  . no estrogen on depo and doing ok.    Preventive Screening-Counseling & Management  Alcohol-Tobacco     Alcohol drinks/day: 0     Smoking Status: never  Caffeine-Diet-Exercise     Caffeine use/day: 1-2     Does Patient Exercise: no  Hep-HIV-STD-Contraception     Sun Exposure-Excessive: no  Safety-Violence-Falls     Seat Belt Use: yes     Smoke Detectors: yes  Current Medications (verified): 1)  Depo-Provera 150 Mg/ml Im Susp (Medroxyprogesterone Acetate)  Allergies (verified): No Known  Drug Allergies  Past History:  Past medical, surgical, family and social histories (including risk factors) reviewed, and no changes noted (except as noted below).  Past Medical History: Reviewed history from 12/20/2009 and no changes required. NL birth Hx  6 4 oz 37 weeks Chicken pox disease Hx [facility] schlatters Pos chlamydia screen 2 10  Last Pap: 2/10 Tdap: 2007  seasonal allergy  .   Allergic rhinitis  Past History:  Care Management: None Current  Family History: Reviewed history from 12/20/2009 and no changes required. no blood clots Mom has large ASD   declining  surgical  i ntervention  Social History: Reviewed history from 12/20/2009 and no changes required. non smoker   no alcohol hh of 5    no ets.  little  milk  no exercise .  School  [employer].   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 sleep less than 8 hours during week   .  8 hours   Review of Systems       The patient complains of chest pain.  The patient denies anorexia, fever, weight loss, weight gain, vision loss, decreased hearing, hoarseness, syncope, dyspnea on exertion, melena, hematochezia, severe indigestion/heartburn, hematuria, abnormal bleeding, enlarged lymph nodes, and angioedema.    Physical Exam  General:  alert, well-developed, and well-nourished.   quiet in nad  Head:  normocephalic and atraumatic.   Eyes:  vision grossly intact, pupils equal, and pupils round.   Neck:  No deformities, masses, or tenderness noted. Chest Wall:  tender near sternum at  [place] area   no masses  area near the pink area  Breasts:  No mass, nodules, thickening, tenderness, bulging, retraction, inflamation, nipple discharge or skin changes noted.   Lungs:  normal respiratory effort, no intercostal retractions, no accessory muscle use, normal breath sounds, no dullness, and no fremitus.   Heart:  normal rate, regular rhythm, no murmur, no  gallop, no rub, no JVD, and no lifts.   Abdomen:  Bowel sounds positive,abdomen soft and non-tender without masses, organomegaly or hernias noted. pierced  Msk:  no joint swelling, no joint warmth, and no redness over joints.   Pulses:  pulses intact without delay   Extremities:  no clubbing cyanosis or edema  Neurologic:  non focal  Skin:  no petechiae and no purpura. round 2-3 cm  red area left parasternal  with 2 tiny papules   not vesicles there  some tenderness back ? faint  red left upper back  Cervical Nodes:  No lymphadenopathy noted Psych:  Oriented X3 and good eye contact.  calm flattened affect.  some anxiety   tearfulat times because of pain.  EKG reviewed .  Ed report reviewed   Impression & Recommendations:  Problem # 1:  CHEST PAIN, ACUTE (ICD-786.50)  continual with wax and wane for 3 days     Not vascular  consider  atypical esophageal or possible  early shingles     add  prilosec  or ranitidine .   also rx for  valtrex  if rash  increasing  as discussed  Severity of the pain ispretty severe . she hasnt taken any meds fo rhtis as doesnt take much meds.  ok to take tylenol or even advilif on the omeprezole .    supposed to go out of town for church issues with mom    .  Unsure if had varicella or immunization.   noter for work return 18th or 19th  or when better .    Orders: EKG w/ Interpretation (93000)  Complete Medication List: 1)  Depo-provera 150 Mg/ml Im Susp (Medroxyprogesterone acetate) 2)  Valacyclovir Hcl 1 Gm Tabs (Valacyclovir hcl) .Marland Kitchen.. 1 by mouth three times a day for 7 days  Patient Instructions: 1)  this does not appear to be your heart .  2)  Suspect  poss esophagus problem or even early shingles . 3)  Rec  begin omeprazole 20 mg every day ( prilosec OTC )   or ranittidine 150 mg two times a day  4)  If the red area progressing then begin the vlatrex also incase this is shingles. 5)  return office visit next week or as needed.   Prescriptions: VALACYCLOVIR HCL 1 GM TABS (VALACYCLOVIR HCL) 1 by mouth three times a day for 7 days  #21 x 0   Entered and Authorized by:   Madelin Headings MD   Signed by:   Neta Mends  Panosh MD on 05/25/2010   Method used:   Print then Give to Patient   RxID:   [id number]

## 2010-09-11 NOTE — Letter (Signed)
Summary: Generic Letter  [place] at [facility]  [address] [place], [place] [address]   Phone: [phone]  Fax: [phone]    03/15/2010  Christine Rose [address] [place], [place]  [address]  Dear Christine Rose,  We were just letting you know that you missed you appointment on August 3rd for a injection. If you are going to continue to get these injections, please call our office to reschedule this appointment. Otherwise please call us to let us know that you are no longer planning on getting these injections or are getting them elsewhere. Our number is [phone].         Sincerely,   Tor Netters, CMA (AAMA)

## 2010-09-11 NOTE — Letter (Signed)
Summary: Generic Letter  [place] at [facility]  [address] [place], [place] [address]   Phone: [phone]  Fax: [phone]    12/22/2009  Edrick Kins [address] [place], [place]  [address]  Dear Ms. Laural Benes,  Your test results were normal. If you have any questions, please call our office at [phone].         Sincerely,   Tor Netters, CMA (AAMA)

## 2010-09-11 NOTE — Letter (Signed)
Summary: Out of Work  Adult nurse at [facility] [employer]  [address]   [place], [place] [address]   Phone: [phone]  Fax: [phone]    May 29, 2010   Employee:  Larwance Sachs    To Whom It May Concern:   For Medical reasons, please excuse the above named employee from work for the following dates:  Start:    End:    If you need additional information, please feel free to contact our office.         Sincerely,    Madelin Headings MD

## 2010-09-11 NOTE — Assessment & Plan Note (Signed)
Summary: DEPO INJ // RS  Nurse Visit   Vital Signs:  Patient profile:   20 year old female Menstrual status:  Depo Weight:      128 pounds Pulse rate:   66 / minute BP sitting:   100 / 60  (left arm) Cuff size:   regular  Vitals Entered By: Romualdo Bolk, CMA (AAMA) (March 16, 2010 3:32 PM) CC: Pt is not having any side effects from Depo.    Current Medications (verified): 1)  Depo-Provera 150 Mg/ml Im Susp (Medroxyprogesterone Acetate)  Allergies (verified): No Known Drug Allergies  Patient Instructions: 1)  Next Depo Due 10/27   Medication Administration  Injection # 1:    Medication: Depo-Provera 150mg     Diagnosis: CONTRACEPTIVE MANAGEMENT (ICD-V25.09)    Route: IM    Site: RUOQ gluteus    Exp Date: 10/10/2012    Lot #: [id number]    Mfr: [email or web address]    Patient tolerated injection without complications    Given by: Romualdo Bolk, CMA (AAMA) (March 16, 2010 3:35 PM)  Orders Added: 1)  Depo-Provera 150mg  [J1055] 2)  Admin of Therapeutic Inj  intramuscular or subcutaneous [04540]

## 2010-09-13 NOTE — Assessment & Plan Note (Signed)
Summary: Stomach issues/cb   Vital Signs:  Patient profile:   20 year old female Menstrual status:  Depo Weight:      123 pounds Temp:     99.2 degrees F oral Pulse rate:   78 / minute BP sitting:   120 / 84  (left arm) Cuff size:   regular  Vitals Entered By: Romualdo Bolk, CMA (AAMA) (July 26, 2010 10:27 AM) CC: Sharp stomach pains on top of stomach and both sides x 2 weeks, some nausea but no vomiting. Pt has had loose stools and fever.   History of Present Illness: Christine Rose comesin  for  acute visit  because ofillness that started December 1st  and worse over the 10th.    Diarrhea  bad on Dec 11th.  almost every 1-2 hours " looked like mushed up food.  "   No vomiting but nausea . Feverish    but   no documentdr fever.   then  took    pepto bismol x 2  No helps. Stooling  decrease since then but still has pain  cramps  mostly left side .   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  No one sick at home.    Has missed work since monday dec 11   has other ? about flushed and reg labs     Preventive Screening-Counseling & Management  Alcohol-Tobacco     Alcohol drinks/day: 0     Smoking Status: never  Caffeine-Diet-Exercise     Caffeine use/day: 1-2     Does Patient Exercise: no  Current Medications (verified): 1)  Depo-Provera 150 Mg/ml Im Susp (Medroxyprogesterone Acetate)  Allergies (verified): No Known Drug Allergies  Past History:  Past medical, surgical, family and social histories (including risk factors) reviewed, and no changes noted (except as noted below).  Past Medical History: Reviewed history from 12/20/2009 and no changes required. NL birth Hx  6 4 oz 37 weeks Chicken pox disease Hx Osgood schlatters Pos chlamydia screen 2 10  Last Pap: 2/10 Tdap: 2007  seasonal allergy  .   Allergic rhinitis  Past History:  Care Management: None Current  Family History: Reviewed history from 12/20/2009  and no changes required. no blood clots Mom has large ASD   declining  surgical  i ntervention  Social History: Reviewed history from 12/20/2009 and no changes required. non smoker   no alcohol hh of 5    no ets.  little  milk  no exercise .  School  [employer].   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 sleep less than 8 hours during week   .  8 hours   Review of Systems       The patient complains of anorexia and fever.  The patient denies weight loss, chest pain, dyspnea on exertion, hemoptysis, melena, hematochezia, severe indigestion/heartburn, hematuria, incontinence, genital sores, muscle weakness, unusual weight change, enlarged lymph nodes, and angioedema.    Physical Exam  General:  Well-developed,well-nourished,in no acute distress; alert,appropriate and cooperative throughout examination her with young child Head:  normocephalic and atraumatic.   Eyes:  vision grossly intact, pupils equal, and pupils round.   Ears:  R ear normal and L ear normal.   Mouth:  pharynx pink and moist.   Neck:  No deformities, masses, or  tenderness noted. Lungs:  Normal respiratory effort, chest expands symmetrically. Lungs are clear to auscultation, no crackles or wheezes.no dullness.   Heart:  Normal rate and regular rhythm. S1 and S2 normal without gallop, murmur, click, rub or other extra sounds.no lifts.   Abdomen:  soft, no distention, no masses, no hepatomegaly, and no splenomegaly.  mild tenderness left lateral abdomen  no g or r ebound  Pulses:  pulses intact without delay   Extremities:  no clubbing cyanosis or edema  Neurologic:  non focal  Skin:  turgor normal, color normal, no ecchymoses, and no petechiae.   Cervical Nodes:  No lymphadenopathy noted Psych:  Oriented X3, good eye contact, not anxious appearing, and not depressed appearing.     Impression & Recommendations:  Problem # 1:  DIARRHEA OF PRESUMED  INFECTIOUS ORIGIN (ICD-009.3) recovering but  still with signs  would get stool samples and r/o c diff    otherwise   supportive care .    Orders: T-Stool Giardia / Crypto- EIA ([address]) T-Culture, Stool (87045/87046-70140) T-C diff by PCR (60454) T-Fecal Lactoferrin (70400) Specimen Handling (99000) Venipuncture (09811)  Problem # 2:  ABDOMINAL CRAMPS (ICD-789.00) left abd  seeems related tot above.  Orders: T-Stool Giardia / Crypto- EIA (91478) T-Culture, Stool (87045/[id number]) T-C diff by PCR (29562) T-Fecal Lactoferrin (70400) Specimen Handling (99000) Venipuncture (13086)  Problem # 3:  OTHER PSYCHOLOGICAL OR PHYSICAL STRESS NEC OTHER (ICD-V62.89) Assessment: Improved chests pain better  would have her make ov with appt in 2012 to disc flushes if still present and lab if approrpiate  had last check up in may 2011  Complete Medication List: 1)  Depo-provera 150 Mg/ml Im Susp (Medroxyprogesterone acetate)  Patient Instructions: 1)  prsume this is bowel infection   getting nbetter  2)  Eat light until better expect continued improvement inthe next week. 3)  collect stool tests    to R/O  c diff etc.   4)  will call results  5)  ROv in not better in another 2 weeks or  as needed.    Orders Added: 1)  T-Stool Giardia / Crypto- EIA [57846] 2)  T-Culture, Stool [87045/[id number]] 3)  T-C diff by PCR [81755] 4)  T-Fecal Lactoferrin [70400] 5)  Specimen Handling [99000] 6)  Venipuncture [96295] 7)  Est. Patient Level IV [28413]

## 2010-09-13 NOTE — Assessment & Plan Note (Signed)
Summary: DEPO INJ//CCM  Nurse Visit   Vital Signs:  Patient profile:   20 year old female Menstrual status:  Depo Weight:      120 pounds Pulse rate:   66 / minute BP sitting:   100 / 60  Vitals Entered By: Romualdo Bolk, CMA (AAMA) (August 29, 2010 10:55 AM) CC: Depo inj- No side effects from medication   Patient Instructions: 1)  Next Depo due April 11,2012 2)  CPX with pap due on or after May 11,2012   Allergies: No Known Drug Allergies  Medication Administration  Injection # 1:    Medication: Depo-Provera 150mg     Diagnosis: CONTRACEPTIVE MANAGEMENT (ICD-V25.09)    Route: IM    Site: LUOQ gluteus    Exp Date: 12/10/2012    Lot #: [id number]    Mfr: [email or web address]    Patient tolerated injection without complications    Given by: Romualdo Bolk, CMA (AAMA) (August 29, 2010 10:57 AM)  Orders Added: 1)  Depo-Provera 150mg  [J1055] 2)  Admin of Therapeutic Inj  intramuscular or subcutaneous [04540]

## 2010-09-19 NOTE — Progress Notes (Signed)
  Phone Note Call from Patient   Caller: Patient Complaint: Abdominal Pain Summary of Call: Pt is having vomiting, sweating and abdominal pain (epigastric) with pain with palpation.  Having diarrhea.  Does not feel like eating.  Same as  last office visit July 26, 2010.  No fever. [phone] Initial call taken by: Lynann Beaver CMA AAMA,  September 11, 2010 11:19 AM  Follow-up for Phone Call        if helps can add phenergan 25 mg  disp 12 1 by mouth q 4-6 hours as needed  anusea and vomiting.   make sure  ( ucg is neg)  if this is recurrent like  in december   then  ov or we can consider getting GI consult.  Follow-up by: Madelin Headings MD,  September 11, 2010 12:54 PM  Additional Follow-up for Phone Call Additional follow up Details #1::        Pt will get UCG results before taking any meds and will speak to Mom about GI referral.  [facility]ico[facility] [place]. Additional Follow-up by: Debby Meadows CMA AAMA,  September 11, 2010 1:28 PM    New/Updated Medications: PROMETHAZINE HCL 25 MG TABS (PROMETHAZINE HCL) one by mouth q 4-6 hours as needed nausea and vomiting. Prescriptions: PROMETHAZINE HCL 25 MG TABS (PROMETHAZINE HCL) one by mouth q 4-6 hours as needed nausea and vomiting.  #12 x 0   Entered by:   Lynann Beaver CMA AAMA   Authorized by:   Madelin Headings MD   Signed by:   Lynann Beaver CMA AAMA on 09/11/2010   Method used:   Electronically to        [facility]  [phone]* (retail)       [address]       [place], [place]  [address]       Ph: [phone] or [phone]       Fax: [phone]   RxID:   [id number]

## 2010-11-21 ENCOUNTER — Ambulatory Visit (INDEPENDENT_AMBULATORY_CARE_PROVIDER_SITE_OTHER): Payer: 59 | Admitting: Internal Medicine

## 2010-11-21 VITALS — BP 100/80 | HR 60 | Wt 120.0 lb

## 2010-11-21 DIAGNOSIS — Z309 Encounter for contraceptive management, unspecified: Secondary | ICD-10-CM

## 2010-11-21 MED ORDER — MEDROXYPROGESTERONE ACETATE 150 MG/ML IM SUSP
150.0000 mg | Freq: Once | INTRAMUSCULAR | Status: AC
  Administered 2010-11-21: 150 mg via INTRAMUSCULAR

## 2010-11-21 NOTE — Patient Instructions (Signed)
Next Depo due July 3rd.

## 2010-12-26 ENCOUNTER — Ambulatory Visit (INDEPENDENT_AMBULATORY_CARE_PROVIDER_SITE_OTHER): Payer: 59 | Admitting: Internal Medicine

## 2010-12-26 ENCOUNTER — Encounter: Payer: Self-pay | Admitting: Internal Medicine

## 2010-12-26 VITALS — BP 100/70 | HR 72 | Ht 63.25 in | Wt 126.0 lb

## 2010-12-26 DIAGNOSIS — Z0000 Encounter for general adult medical examination without abnormal findings: Secondary | ICD-10-CM | POA: Insufficient documentation

## 2010-12-26 DIAGNOSIS — J309 Allergic rhinitis, unspecified: Secondary | ICD-10-CM

## 2010-12-26 DIAGNOSIS — Z113 Encounter for screening for infections with a predominantly sexual mode of transmission: Secondary | ICD-10-CM

## 2010-12-26 NOTE — Progress Notes (Signed)
  Subjective:    Patient ID: Christine Rose, female    DOB: [birth year], 20 y.o.   MRN: [id number]  HPI Patient comes in today for a wellness check. Since her last visit she is had noted change in her medical history. No stroke periods now  On Dep . No GYN symptoms. No current relationship.  Sleep  Poor    Trying to go to sleep at night.  Review of Systems Chest pain intermittent felt secondary to stress occasional low back pain possibly related to work no numbness weakness coughing shortness of breath new GI disturbance. Vision and hearing okay  Past history family history social history reviewed in the electronic medical record.      Objective:   Physical Exam Physical Exam: Vital signs reviewed [id number] is a well-developed well-nourished alert cooperative  aa  female who appears her stated age in no acute distress.  HEENT: normocephalic  traumatic , Eyes: PERRL EOM's full, conjunctiva clear, Nares: paten,t no deformity discharge or tenderness., Ears: no deformity EAC's clear TMs with normal landmarks. Mouth: clear OP, no lesions, edema.  Moist mucous membranes. Dentition in adequate repair. NECK: supple without masses, thyromegaly or bruits. CHEST/PULM:  Clear to auscultation and percussion breath sounds equal no wheeze , rales or rhonchi. No chest wall deformities or tenderness. CV: PMI is nondisplaced, S1 S2 no gallops, murmurs, rubs. Peripheral pulses are full without delay.No JVD .  Breast: normal by inspection . No dimpling, discharge, masses, tenderness or discharge . ABDOMEN: Bowel sounds normal nontender  No guard or rebound, no hepato splenomegal no CVA tenderness.  No hernia. Extremtities:  No clubbing cyanosis or edema, no acute joint swelling or redness no focal atrophy NEURO:  Oriented x3, cranial nerves 3-12 appear to be intact, no obvious focal weakness,gait within normal limits no abnormal reflexes or asymmetrical SKIN: No acute rashes normal turgor, color, no  bruising or petechiae. PSYCH: Oriented, good eye contact, no obvious depression anxiety, cognition and judgment appear normal.      Assessment & Plan:  Preventive Health Care Dysmenorrhea   Better on Depo Cp  Better  Stress.   Declines blood work today. Counseled.    Counseled regarding healthy nutrition, exercise, sleep, injury prevention, calcium vit d and healthy weight .

## 2010-12-26 NOTE — Patient Instructions (Signed)
Will notify you  of labs when available. Continue lifestyle intervention healthy eating and exercise . Call if prn . Dep shot   Yearly check up.

## 2010-12-27 ENCOUNTER — Encounter: Payer: Self-pay | Admitting: Internal Medicine

## 2010-12-27 DIAGNOSIS — J309 Allergic rhinitis, unspecified: Secondary | ICD-10-CM | POA: Insufficient documentation

## 2010-12-27 LAB — GC/CHLAMYDIA PROBE AMP, URINE: GC Probe Amp, Urine: NEGATIVE

## 2010-12-31 NOTE — Progress Notes (Signed)
Pt aware of results 

## 2011-02-11 ENCOUNTER — Encounter: Payer: Self-pay | Admitting: Internal Medicine

## 2011-02-11 ENCOUNTER — Ambulatory Visit (INDEPENDENT_AMBULATORY_CARE_PROVIDER_SITE_OTHER): Payer: 59 | Admitting: Internal Medicine

## 2011-02-11 DIAGNOSIS — Z3042 Encounter for surveillance of injectable contraceptive: Secondary | ICD-10-CM

## 2011-02-11 DIAGNOSIS — Z309 Encounter for contraceptive management, unspecified: Secondary | ICD-10-CM

## 2011-02-11 DIAGNOSIS — Z3049 Encounter for surveillance of other contraceptives: Secondary | ICD-10-CM

## 2011-02-11 MED ORDER — MEDROXYPROGESTERONE ACETATE 150 MG/ML IM SUSP
150.0000 mg | Freq: Once | INTRAMUSCULAR | Status: AC
  Administered 2011-02-11: 150 mg via INTRAMUSCULAR

## 2011-02-11 NOTE — Patient Instructions (Signed)
Next depo due Sept 23.

## 2011-02-12 ENCOUNTER — Ambulatory Visit: Payer: 59 | Admitting: Internal Medicine

## 2011-03-04 ENCOUNTER — Encounter: Payer: Self-pay | Admitting: Internal Medicine

## 2011-03-04 DIAGNOSIS — Z309 Encounter for contraceptive management, unspecified: Secondary | ICD-10-CM | POA: Insufficient documentation

## 2011-03-04 DIAGNOSIS — Z3042 Encounter for surveillance of injectable contraceptive: Secondary | ICD-10-CM | POA: Insufficient documentation

## 2011-03-04 NOTE — Progress Notes (Signed)
  Subjective:    Patient ID: Christine Rose, female    DOB: 01-13-1991, 20 y.o.   MRN: [id number]  HPI  No problems reported per nursing personnel and on time .  VS reviewed  Review of Systems     Objective:   Physical Exam        Assessment & Plan:

## 2011-05-03 ENCOUNTER — Ambulatory Visit (INDEPENDENT_AMBULATORY_CARE_PROVIDER_SITE_OTHER): Payer: 59 | Admitting: Internal Medicine

## 2011-05-03 VITALS — BP 100/60 | HR 60 | Wt 122.0 lb

## 2011-05-03 DIAGNOSIS — Z309 Encounter for contraceptive management, unspecified: Secondary | ICD-10-CM

## 2011-05-03 MED ORDER — MEDROXYPROGESTERONE ACETATE 150 MG/ML IM SUSP
150.0000 mg | Freq: Once | INTRAMUSCULAR | Status: AC
  Administered 2011-05-03: 150 mg via INTRAMUSCULAR

## 2011-05-03 NOTE — Patient Instructions (Signed)
Next Depo Due Dec 13

## 2011-07-25 ENCOUNTER — Ambulatory Visit: Payer: 59 | Admitting: Internal Medicine

## 2011-07-25 ENCOUNTER — Ambulatory Visit (INDEPENDENT_AMBULATORY_CARE_PROVIDER_SITE_OTHER): Payer: 59 | Admitting: Internal Medicine

## 2011-07-25 DIAGNOSIS — Z309 Encounter for contraceptive management, unspecified: Secondary | ICD-10-CM

## 2011-07-25 MED ORDER — METHYLPREDNISOLONE ACETATE PF 80 MG/ML IJ SUSP
120.0000 mg | Freq: Once | INTRAMUSCULAR | Status: AC
  Administered 2011-07-25: 120 mg via INTRAMUSCULAR

## 2011-07-25 NOTE — Patient Instructions (Signed)
Next Depo Due 10/16/11 CPX is due 12/26/11

## 2011-07-25 NOTE — Progress Notes (Signed)
  Subjective:    Patient ID: Christine Rose, female    DOB: [birth year], 20 y.o.   MRN: [id number]  HPI  Pt comesin for depoprovera   Nurse took hx  And gave injection  No problem with period or sx of concern. Vs stable  Review of Systems     Objective:   Physical Exam        Assessment & Plan:  Continue depo.

## 2011-07-26 MED ORDER — MEDROXYPROGESTERONE ACETATE 150 MG/ML IM SUSP
150.0000 mg | Freq: Once | INTRAMUSCULAR | Status: AC
  Administered 2011-07-26: 150 mg via INTRAMUSCULAR

## 2011-07-26 NOTE — Progress Notes (Signed)
Addended by: Romualdo Bolk on: 07/26/2011 01:49 PM   Modules accepted: Orders

## 2011-10-15 ENCOUNTER — Ambulatory Visit: Payer: 59 | Admitting: Internal Medicine

## 2011-10-16 ENCOUNTER — Ambulatory Visit (INDEPENDENT_AMBULATORY_CARE_PROVIDER_SITE_OTHER): Payer: 59 | Admitting: *Deleted

## 2011-10-16 DIAGNOSIS — Z309 Encounter for contraceptive management, unspecified: Secondary | ICD-10-CM

## 2011-10-16 MED ORDER — MEDROXYPROGESTERONE ACETATE 150 MG/ML IM SUSP
150.0000 mg | Freq: Once | INTRAMUSCULAR | Status: AC
  Administered 2011-10-16: 150 mg via INTRAMUSCULAR

## 2011-12-27 ENCOUNTER — Encounter: Payer: Self-pay | Admitting: Internal Medicine

## 2011-12-27 ENCOUNTER — Ambulatory Visit (INDEPENDENT_AMBULATORY_CARE_PROVIDER_SITE_OTHER): Payer: 59 | Admitting: Internal Medicine

## 2011-12-27 ENCOUNTER — Other Ambulatory Visit (HOSPITAL_COMMUNITY)
Admission: RE | Admit: 2011-12-27 | Discharge: 2011-12-27 | Disposition: A | Discharge status: Home / Self Care | Payer: 59 | Source: Ambulatory Visit | Attending: Internal Medicine | Admitting: Internal Medicine

## 2011-12-27 VITALS — BP 100/68 | HR 83 | Temp 98.2°F | Wt 121.0 lb

## 2011-12-27 DIAGNOSIS — Z0000 Encounter for general adult medical examination without abnormal findings: Secondary | ICD-10-CM

## 2011-12-27 DIAGNOSIS — Z3042 Encounter for surveillance of injectable contraceptive: Secondary | ICD-10-CM | POA: Insufficient documentation

## 2011-12-27 DIAGNOSIS — Z01419 Encounter for gynecological examination (general) (routine) without abnormal findings: Secondary | ICD-10-CM | POA: Insufficient documentation

## 2011-12-27 DIAGNOSIS — Z113 Encounter for screening for infections with a predominantly sexual mode of transmission: Secondary | ICD-10-CM | POA: Insufficient documentation

## 2011-12-27 DIAGNOSIS — N909 Noninflammatory disorder of vulva and perineum, unspecified: Secondary | ICD-10-CM

## 2011-12-27 DIAGNOSIS — Z3049 Encounter for surveillance of other contraceptives: Secondary | ICD-10-CM

## 2011-12-27 DIAGNOSIS — R87810 Cervical high risk human papillomavirus (HPV) DNA test positive: Secondary | ICD-10-CM | POA: Insufficient documentation

## 2011-12-27 NOTE — Patient Instructions (Addendum)
Will notify you  of labs  PAPwhen available.  Unsure if the area of concern is a nevus  Of no consequence or  some more important  cause.  I would like you to have a Gyne to see this area. We can do a referral consult. Add vit d supplement if not getting any in  your food. Check up in a year or asneeded

## 2011-12-27 NOTE — Progress Notes (Signed)
Subjective:    Patient ID: Christine Rose, female    DOB: [birth year], 21 y.o.   MRN: [id number]  HPI Patient comes in today for Preventive Health Care visit  No major change in health status since last visit . She has noted recently a dark area in vaginal area without pain or dc . Doesn't think it has been there before.  Wants to continue depo.  No menses 1 partner x 6 months   Stress sx are down   Review of Systems ROS:  GEN/ HEENT: No fever, significant weight changes sweats headaches vision problems hearing changes, CV/ PULM; No chest pain shortness of breath cough, syncope,edema  change in exercise tolerance. GI /GU: No current adominal pain, vomiting, change in bowel habits. No blood in the stool. No significant GU symptoms. SKIN/HEME: ,no acute skin rashes suspicious lesions or bleeding. No lymphadenopathy, nodules, masses.  NEURO/ PSYCH:  No neurologic signs such as weakness numbness. No depression anxiety. IMM/ Allergy: No unusual infections.  Allergy .   REST of 12 system review negative except as per HPI Outpatient Encounter Prescriptions as of 12/27/2011  Medication Sig Dispense Refill  . medroxyPROGESTERone (DEPO-PROVERA) 150 MG/ML injection Inject 150 mg into the muscle every 3 (three) months.         Past history family history social history reviewed in the electronic medical record.       Objective:   Physical Exam BP 100/68  Pulse 83  Temp(Src) 98.2 F (36.8 C) (Oral)  Wt 121 lb (54.885 kg)  SpO2 99% Physical Exam: Vital signs reviewed [id number] is a well-developed well-nourished alert cooperative  AA female who appears her stated age in no acute distress.  HEENT: normocephalic atraumatic , Eyes: PERRL EOM's full, conjunctiva clear, Nares: paten,t no deformity discharge or tenderness., Ears: no deformity EAC's clear TMs with normal landmarks. Mouth: clear OP, no lesions, edema.  Moist mucous membranes. Dentition in adequate repair. NECK: supple without  masses, thyromegaly or bruits. CHEST/PULM:  Clear to auscultation and percussion breath sounds equal no wheeze , rales or rhonchi. No chest wall deformities or tenderness. CV: PMI is nondisplaced, S1 S2 no gallops, murmurs, rubs. Peripheral pulses are full without delay.No JVD .  Breast: normal by inspection . No dimpling, discharge, masses, tenderness or discharge .  ABDOMEN: Bowel sounds normal nontender  No guard or rebound, no hepato splenomegal no CVA tenderness.  No hernia. Extremtities:  No clubbing cyanosis or edema, no acute joint swelling or redness no focal atrophy NEURO:  Oriented x3, cranial nerves 3-12 appear to be intact, no obvious focal weakness,gait within normal limits no abnormal reflexes or asymmetrical SKIN: No acute rashes normal turgor, color, no bruising or petechiae. PSYCH: Oriented, good eye contact, no obvious depression anxiety, cognition and judgment appear normal. LN: no cervical axillary inguinal adenopathy Pelvic: NL ext GU, labia clear   Posterior fourcheete 3 small flat dark black ish  2-3 mm area with some white area surrounding  No pain ulcer or HPV   Vagina no lesions .Cervix: clear  Blood with brush no ectopy UTERUS: Neg CMT Adnexa:  clear no masses . PAP done with [facility] reflex hpv    Assessment & Plan:  Preventive Health Care Counseled regarding healthy nutrition, exercise, sleep, injury prevention, calcium vit d and healthy weight .sti prevention not high risk add vit d supp if needed STI screen [facility] hiv rpr and cbc to be done at [facility] as requested Contraception  wants to continue on Depo-Provera.  Marland Kitchen  Lesion at post fourchette ? If nevus or other  Get gyne to check may need derm but get gyne to check first.

## 2012-01-07 ENCOUNTER — Ambulatory Visit (INDEPENDENT_AMBULATORY_CARE_PROVIDER_SITE_OTHER): Payer: 59

## 2012-01-07 ENCOUNTER — Ambulatory Visit: Payer: 59 | Admitting: *Deleted

## 2012-01-07 DIAGNOSIS — Z309 Encounter for contraceptive management, unspecified: Secondary | ICD-10-CM

## 2012-01-07 MED ORDER — MEDROXYPROGESTERONE ACETATE 150 MG/ML IM SUSP
150.0000 mg | Freq: Once | INTRAMUSCULAR | Status: AC
  Administered 2012-01-07: 150 mg via INTRAMUSCULAR

## 2012-01-08 ENCOUNTER — Encounter: Payer: Self-pay | Admitting: Internal Medicine

## 2012-01-08 DIAGNOSIS — IMO0001 Reserved for inherently not codable concepts without codable children: Secondary | ICD-10-CM

## 2012-01-08 DIAGNOSIS — R896 Abnormal cytological findings in specimens from other organs, systems and tissues: Secondary | ICD-10-CM

## 2012-01-08 HISTORY — DX: Reserved for inherently not codable concepts without codable children: IMO0001

## 2012-01-21 ENCOUNTER — Telehealth: Payer: Self-pay | Admitting: Family Medicine

## 2012-02-04 ENCOUNTER — Encounter: Payer: Self-pay | Admitting: Internal Medicine

## 2012-02-11 NOTE — Telephone Encounter (Signed)
Open in error

## 2012-03-30 ENCOUNTER — Ambulatory Visit: Payer: 59

## 2012-03-31 ENCOUNTER — Ambulatory Visit (INDEPENDENT_AMBULATORY_CARE_PROVIDER_SITE_OTHER): Payer: 59

## 2012-03-31 DIAGNOSIS — Z309 Encounter for contraceptive management, unspecified: Secondary | ICD-10-CM

## 2012-03-31 MED ORDER — MEDROXYPROGESTERONE ACETATE 150 MG/ML IM SUSP
150.0000 mg | Freq: Once | INTRAMUSCULAR | Status: AC
  Administered 2012-03-31: 150 mg via INTRAMUSCULAR

## 2012-06-22 ENCOUNTER — Ambulatory Visit: Payer: 59

## 2012-06-23 ENCOUNTER — Ambulatory Visit: Payer: 59

## 2012-06-23 ENCOUNTER — Ambulatory Visit (INDEPENDENT_AMBULATORY_CARE_PROVIDER_SITE_OTHER): Payer: 59 | Admitting: Family Medicine

## 2012-06-23 DIAGNOSIS — Z309 Encounter for contraceptive management, unspecified: Secondary | ICD-10-CM

## 2012-06-23 MED ORDER — MEDROXYPROGESTERONE ACETATE 150 MG/ML IM SUSP
150.0000 mg | Freq: Once | INTRAMUSCULAR | Status: AC
  Administered 2012-06-23: 150 mg via INTRAMUSCULAR

## 2012-07-21 ENCOUNTER — Ambulatory Visit (INDEPENDENT_AMBULATORY_CARE_PROVIDER_SITE_OTHER): Payer: 59 | Admitting: Internal Medicine

## 2012-07-21 ENCOUNTER — Encounter: Payer: Self-pay | Admitting: Internal Medicine

## 2012-07-21 VITALS — BP 124/80 | HR 100 | Temp 101.5°F | Ht 64.0 in | Wt 132.0 lb

## 2012-07-21 DIAGNOSIS — IMO0002 Reserved for concepts with insufficient information to code with codable children: Secondary | ICD-10-CM | POA: Insufficient documentation

## 2012-07-21 DIAGNOSIS — R6889 Other general symptoms and signs: Secondary | ICD-10-CM

## 2012-07-21 DIAGNOSIS — J111 Influenza due to unidentified influenza virus with other respiratory manifestations: Secondary | ICD-10-CM

## 2012-07-21 LAB — POCT INFLUENZA A/B: Influenza A, POC: NEGATIVE

## 2012-07-21 MED ORDER — OSELTAMIVIR PHOSPHATE 75 MG PO CAPS: 75.0000 mg | ORAL_CAPSULE | Freq: Two times a day (BID) | ORAL | Status: DC

## 2012-07-21 NOTE — Progress Notes (Signed)
Chief Complaint  Patient presents with  . Annual Exam    Pt comes today with fever, bodyache, nasal congesiton, sore throt and headache.  Started over the weekend.     HPI: Pt comes  in for fu of abnormal pap but has developed an illness in the meantime  And is sick today wants to delay the pap. Has stomach virus a few weeks ago  Now this started about 2-3 days ago.  Onset  with st and cough  And then fever.   First   Night.   With chills cough no sob. 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 No unusual rashes  ROS: See pertinent positives and negatives per HPI. No uti sx  No cp   Past Medical History  Diagnosis Date  . History of chicken pox   . Osgood-Schlatter's disease     hx  . Screening for chlamydial disease 09/2008    pos test  . Seasonal allergies   . Allergic rhinitis     Family History  Problem Relation Age of Onset  . Other Mother     large ASD declining surgical intervention    History   Social History  . Marital Status: Single    Spouse Name: N/A    Number of Children: N/A  . Years of Education: N/A   Social History Main Topics  . Smoking status: Never Smoker   . Smokeless tobacco: None  . Alcohol Use: No  . Drug Use: No  . Sexually Active: None   Other Topics Concern  . 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 interested in obgynearea but may do PT assistant No exercise except work.As off because of work schedule.Negative TADAvoids milk     Outpatient Encounter Prescriptions as of 07/21/2012  Medication Sig Dispense Refill  . medroxyPROGESTERone (DEPO-PROVERA) 150 MG/ML injection Inject 150 mg into the muscle every 3 (three) months.        Marland Kitchen oseltamivir (TAMIFLU) 75 MG capsule Take 1 capsule (75 mg total) by mouth 2 (two) times daily.  10 capsule  0    EXAM:  BP 124/80  Pulse 100  Temp 101.5 F (38.6 C) (Oral)  Ht 5\' 4"   (1.626 m)  Wt 132 lb (59.875 kg)  BMI 22.66 kg/m2  SpO2 97%  Body mass index is 22.66 kg/(m^2).  WDWN in NAD  quiet respirations; mildly congested  somewhat hoarse. Non toxic . But ill appearing  HEENT: Normocephalic ;atraumatic , Eyes;  PERRL, EOMs  Full, lids and conjunctiva clear,,Ears: no deformities, canals nl, TM landmarks normal, Nose: no deformity or discharge but congested;face minimally tender Mouth : OP clear without lesion or edema . Neck: Supple without adenopathy or masses or bruits Chest:  Clear to A&P without wheezes rales or rhonchi CV:  S1-S2 no gallops or murmurs peripheral perfusion is normal Skin :nl perfusion and no acute rashes  Abdomen:  Sof,t normal bowel sounds without hepatosplenomegaly, no guarding rebound or masses no CVA tenderness  MS: moves all extremities without noticeable focal  abnormality  PSYCH: pleasant and cooperative, no obvious depression or anxiety  ASSESSMENT AND PLAN:  Discussed the following assessment and plan:  1. Abnormal Pap smear     lgsil poss higher grade  plan return for exam and repeat autelyill today  2. 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     -Patient advised to return or notify health care team  immediately if symptoms worsen or persist or new concerns arise.  Patient Instructions  This acts like influenza  Fever can last up to 5 days  Take the tamiflu contact us if fever not gon in another 48 hours or so of other worsening. No work until fever gone for 24 hours .  Reschedule the pap appt.   Influenza    Influenza Facts Flu (influenza) is a contagious respiratory illness caused by the influenza viruses. It can cause mild to severe illness. While most healthy people recover from the flu without specific treatment and without complications, older people, young children, and people with certain health conditions are at higher risk for serious complications from the  flu, including death. CAUSES   The flu virus is spread from person to person by respiratory droplets from coughing and sneezing.  A person can also become infected by touching an object or surface with a virus on it and then touching their mouth, eye or nose.  Adults may be able to infect others from 1 day before symptoms occur and up to 7 days after getting sick. So it is possible to give someone the flu even before you know you are sick and continue to infect others while you are sick. SYMPTOMS   Fever (usually high).  Headache.  Tiredness (can be extreme).  Cough.  Sore throat.  Runny or stuffy nose.  Body aches.  Diarrhea and vomiting may also occur, particularly in children.  These symptoms are referred to as "flu-like symptoms". A lot of different illnesses, including the common cold, can have similar symptoms. DIAGNOSIS   There are tests that can determine if you have the flu as long you are tested within the first 2 or 3 days of illness.  A doctor's exam and additional tests may be needed to identify if you have a disease that is a complicating the flu. RISKS AND COMPLICATIONS  Some of the complications caused by the flu include:  Bacterial pneumonia or progressive pneumonia caused by the flu virus.  Loss of body fluids (dehydration).  Worsening of chronic medical conditions, such as heart failure, asthma, or diabetes.  Sinus problems and ear infections. HOME CARE INSTRUCTIONS   Seek medical care early on.  If you are at high risk from complications of the flu, consult your health-care provider as soon as you develop flu-like symptoms. Those at high risk for complications include:  People 65 years or older.  People with chronic medical conditions, including diabetes.  Pregnant women.  Young children.  Your caregiver may recommend use of an antiviral medication to help treat the flu.  If you get the flu, get plenty of rest, drink a lot of liquids, and  avoid using alcohol and tobacco.  You can take over-the-counter medications to relieve the symptoms of the flu if your caregiver approves. (Never give aspirin to children or teenagers who have flu-like symptoms, particularly fever). PREVENTION  The single best way to prevent the flu is to get a flu vaccine each fall. Other measures that can help protect against the flu are:  Antiviral Medications  A number of antiviral drugs are approved for use in preventing the flu. These are prescription medications, and a doctor should be consulted before they are used.  Habits for Good Health  Cover your nose and mouth with a tissue when you cough  or sneeze, throw the tissue away after you use it.  Wash your hands often with soap and water, especially after you cough or sneeze. If you are not near water, use an alcohol-based hand cleaner.  Avoid people who are sick.  If you get the flu, stay home from work or school. Avoid contact with other people so that you do not make them sick, too.  Try not to touch your eyes, nose, or mouth as germs ore often spread this way. IN CHILDREN, EMERGENCY WARNING SIGNS THAT NEED URGENT MEDICAL ATTENTION:  Fast breathing or trouble breathing.  Bluish skin color.  Not drinking enough fluids.  Not waking up or not interacting.  Being so irritable that the child does not want to be held.  Flu-like symptoms improve but then return with fever and worse cough.  Fever with a rash. IN ADULTS, EMERGENCY WARNING SIGNS THAT NEED URGENT MEDICAL ATTENTION:  Difficulty breathing or shortness of breath.  Pain or pressure in the chest or abdomen.  Sudden dizziness.  Confusion.  Severe or persistent vomiting. SEEK IMMEDIATE MEDICAL CARE IF:  You or someone you know is experiencing any of the symptoms above. When you arrive at the emergency center,report that you think you have the flu. You may be asked to wear a mask and/or sit in a secluded area to protect others  from getting sick. MAKE SURE YOU:   Understand these instructions.  Monitor your condition.  Seek medical care if you are getting worse, or not improving. Document Released: 08/01/2003 Document Revised: 10/21/2011 Document Reviewed: 04/27/2009 [facility] Patient Information 2013 [place], [place]. Influenza, Adult Influenza ("the flu") is a viral infection of the respiratory tract. It occurs more often in winter months because people spend more time in close contact with one another. Influenza can make you feel very sick. Influenza easily spreads from person to person (contagious). CAUSES  Influenza is caused by a virus that infects the respiratory tract. You can catch the virus by breathing in droplets from an infected person's cough or sneeze. You can also catch the virus by touching something that was recently contaminated with the virus and then touching your mouth, nose, or eyes. SYMPTOMS  Symptoms typically last 4 to 10 days and may include:  Fever.  Chills.  Headache, body aches, and muscle aches.  Sore throat.  Chest discomfort and cough.  Poor appetite.  Weakness or feeling tired.  Dizziness.  Nausea or vomiting. DIAGNOSIS  Diagnosis of influenza is often made based on your history and a physical exam. A nose or throat swab test can be done to confirm the diagnosis. RISKS AND COMPLICATIONS You may be at risk for a more severe case of influenza if you smoke cigarettes, have diabetes, have chronic heart disease (such as heart failure) or lung disease (such as asthma), or if you have a weakened immune system. Elderly people and pregnant women are also at risk for more serious infections. The most common complication of influenza is a lung infection (pneumonia). Sometimes, this complication can require emergency medical care and may be life-threatening. PREVENTION  An annual influenza vaccination (flu shot) is the best way to avoid getting influenza. An annual flu shot is now  routinely recommended for all adults in the [place] TREATMENT  In mild cases, influenza goes away on its own. Treatment is directed at relieving symptoms. For more severe cases, your caregiver may prescribe antiviral medicines to shorten the sickness. Antibiotic medicines are not effective, because the infection is caused by  a virus, not by bacteria. HOME CARE INSTRUCTIONS  Only take over-the-counter or prescription medicines for pain, discomfort, or fever as directed by your caregiver.  Use a cool mist humidifier to make breathing easier.  Get plenty of rest until your temperature returns to normal. This usually takes 3 to 4 days.  Drink enough fluids to keep your urine clear or pale yellow.  Cover your mouth and nose when coughing or sneezing, and wash your hands well to avoid spreading the virus.  Stay home from work or school until your fever has been gone for at least 1 full day. SEEK MEDICAL CARE IF:   You have chest pain or a deep cough that worsens or produces more mucus.  You have nausea, vomiting, or diarrhea. SEEK IMMEDIATE MEDICAL CARE IF:   You have difficulty breathing, shortness of breath, or your skin or nails turn bluish.  You have severe neck pain or stiffness.  You have a severe headache, facial pain, or earache.  You have a worsening or recurring fever.  You have nausea or vomiting that cannot be controlled. MAKE SURE YOU:  Understand these instructions.  Will watch your condition.  Will get help right away if you are not doing well or get worse. Document Released: 07/26/2000 Document Revised: 01/28/2012 Document Reviewed: 10/28/2011 [facility] Patient Information 2013 [place], [place].      Neta Mends. Panosh M.D.   ROS: See pertinent positives and negatives per HPI.  Past Medical History  Diagnosis Date  . History of chicken pox   . Osgood-Schlatter's disease     hx  . Screening for chlamydial disease 09/2008    pos test  . Seasonal allergies     . Allergic rhinitis     Family History  Problem Relation Age of Onset  . Other Mother     large ASD declining surgical intervention    History   Social History  . Marital Status: Single    Spouse Name: N/A    Number of Children: N/A  . Years of Education: N/A   Social History Main Topics  . Smoking status: Never Smoker   . Smokeless tobacco: None  . Alcohol Use: No  . Drug Use: No  . Sexually Active: None   Other Topics Concern  . 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 interested in obgynearea but may do PT assistant No exercise except work.As off because of work schedule.Negative TADAvoids milk     Outpatient Encounter Prescriptions as of 07/21/2012  Medication Sig Dispense Refill  . medroxyPROGESTERone (DEPO-PROVERA) 150 MG/ML injection Inject 150 mg into the muscle every 3 (three) months.        Marland Kitchen oseltamivir (TAMIFLU) 75 MG capsule Take 1 capsule (75 mg total) by mouth 2 (two) times daily.  10 capsule  0    EXAM:  BP 124/80  Pulse 100  Temp 101.5 F (38.6 C) (Oral)  Ht 5\' 4"  (1.626 m)  Wt 132 lb (59.875 kg)  BMI 22.66 kg/m2  SpO2 97%  Body mass index is 22.66 kg/(m^2).  GENERAL: vitals reviewed and listed above, alert, oriented, appears well hydrated and in no acute distress  HEENT: atraumatic, conjunctiva  clear, no obvious abnormalities on inspection of external nose and ears OP : no lesion edema or exudate   NECK: no obvious masses on inspection palpation   LUNGS: clear  to auscultation bilaterally, no wheezes, rales or rhonchi, good air movement  CV: HRRR, no clubbing cyanosis or  peripheral edema nl cap refill   MS: moves all extremities without noticeable focal  abnormality  PSYCH: pleasant and cooperative, no obvious depression or anxiety  ASSESSMENT AND PLAN:  Discussed the following assessment and plan:  1. Abnormal Pap smear      lgsil poss higher grade  plan return for exam and repeat autelyill today  2. 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   hydration seem adequate this time . Note for work return when fever gone .  Rapid flu is negative today.  Risk benefit of medication discussed.supportive care  -Patient advised to return or notify health care team  immediately if symptoms worsen or persist or new concerns arise.  Patient Instructions  This acts like influenza  Fever can last up to 5 days  Take the tamiflu contact us if fever not gon in another 48 hours or so of other worsening. No work until fever gone for 24 hours .  Reschedule the pap appt.   Influenza    Influenza Facts Flu (influenza) is a contagious respiratory illness caused by the influenza viruses. It can cause mild to severe illness. While most healthy people recover from the flu without specific treatment and without complications, older people, young children, and people with certain health conditions are at higher risk for serious complications from the flu, including death. CAUSES   The flu virus is spread from person to person by respiratory droplets from coughing and sneezing.  A person can also become infected by touching an object or surface with a virus on it and then touching their mouth, eye or nose.  Adults may be able to infect others from 1 day before symptoms occur and up to 7 days after getting sick. So it is possible to give someone the flu even before you know you are sick and continue to infect others while you are sick. SYMPTOMS   Fever (usually high).  Headache.  Tiredness (can be extreme).  Cough.  Sore throat.  Runny or stuffy nose.  Body aches.  Diarrhea and vomiting may also occur, particularly in children.  These symptoms are referred to as "flu-like symptoms". A lot of different illnesses, including the common cold, can have similar  symptoms. DIAGNOSIS   There are tests that can determine if you have the flu as long you are tested within the first 2 or 3 days of illness.  A doctor's exam and additional tests may be needed to identify if you have a disease that is a complicating the flu. RISKS AND COMPLICATIONS  Some of the complications caused by the flu include:  Bacterial pneumonia or progressive pneumonia caused by the flu virus.  Loss of body fluids (dehydration).  Worsening of chronic medical conditions, such as heart failure, asthma, or diabetes.  Sinus problems and ear infections. HOME CARE INSTRUCTIONS   Seek medical care early on.  If you are at high risk from complications of the flu, consult your health-care provider as soon as you develop flu-like symptoms. Those at high risk for complications include:  People 65 years or older.  People with chronic medical conditions, including diabetes.  Pregnant women.  Young children.  Your caregiver may recommend use of an antiviral medication to help treat the flu.  If you get the flu, get plenty of rest, drink a lot  of liquids, and avoid using alcohol and tobacco.  You can take over-the-counter medications to relieve the symptoms of the flu if your caregiver approves. (Never give aspirin to children or teenagers who have flu-like symptoms, particularly fever). PREVENTION  The single best way to prevent the flu is to get a flu vaccine each fall. Other measures that can help protect against the flu are:  Antiviral Medications  A number of antiviral drugs are approved for use in preventing the flu. These are prescription medications, and a doctor should be consulted before they are used.  Habits for Good Health  Cover your nose and mouth with a tissue when you cough or sneeze, throw the tissue away after you use it.  Wash your hands often with soap and water, especially after you cough or sneeze. If you are not near water, use an alcohol-based hand  cleaner.  Avoid people who are sick.  If you get the flu, stay home from work or school. Avoid contact with other people so that you do not make them sick, too.  Try not to touch your eyes, nose, or mouth as germs ore often spread this way. IN CHILDREN, EMERGENCY WARNING SIGNS THAT NEED URGENT MEDICAL ATTENTION:  Fast breathing or trouble breathing.  Bluish skin color.  Not drinking enough fluids.  Not waking up or not interacting.  Being so irritable that the child does not want to be held.  Flu-like symptoms improve but then return with fever and worse cough.  Fever with a rash. IN ADULTS, EMERGENCY WARNING SIGNS THAT NEED URGENT MEDICAL ATTENTION:  Difficulty breathing or shortness of breath.  Pain or pressure in the chest or abdomen.  Sudden dizziness.  Confusion.  Severe or persistent vomiting. SEEK IMMEDIATE MEDICAL CARE IF:  You or someone you know is experiencing any of the symptoms above. When you arrive at the emergency center,report that you think you have the flu. You may be asked to wear a mask and/or sit in a secluded area to protect others from getting sick. MAKE SURE YOU:   Understand these instructions.  Monitor your condition.  Seek medical care if you are getting worse, or not improving. Document Released: 08/01/2003 Document Revised: 10/21/2011 Document Reviewed: 04/27/2009 [facility] Patient Information 2013 [place], [place]. Influenza, Adult Influenza ("the flu") is a viral infection of the respiratory tract. It occurs more often in winter months because people spend more time in close contact with one another. Influenza can make you feel very sick. Influenza easily spreads from person to person (contagious). CAUSES  Influenza is caused by a virus that infects the respiratory tract. You can catch the virus by breathing in droplets from an infected person's cough or sneeze. You can also catch the virus by touching something that was recently  contaminated with the virus and then touching your mouth, nose, or eyes. SYMPTOMS  Symptoms typically last 4 to 10 days and may include:  Fever.  Chills.  Headache, body aches, and muscle aches.  Sore throat.  Chest discomfort and cough.  Poor appetite.  Weakness or feeling tired.  Dizziness.  Nausea or vomiting. DIAGNOSIS  Diagnosis of influenza is often made based on your history and a physical exam. A nose or throat swab test can be done to confirm the diagnosis. RISKS AND COMPLICATIONS You may be at risk for a more severe case of influenza if you smoke cigarettes, have diabetes, have chronic heart disease (such as heart failure) or lung disease (such as asthma), or if  you have a weakened immune system. Elderly people and pregnant women are also at risk for more serious infections. The most common complication of influenza is a lung infection (pneumonia). Sometimes, this complication can require emergency medical care and may be life-threatening. PREVENTION  An annual influenza vaccination (flu shot) is the best way to avoid getting influenza. An annual flu shot is now routinely recommended for all adults in the [place] TREATMENT  In mild cases, influenza goes away on its own. Treatment is directed at relieving symptoms. For more severe cases, your caregiver may prescribe antiviral medicines to shorten the sickness. Antibiotic medicines are not effective, because the infection is caused by a virus, not by bacteria. HOME CARE INSTRUCTIONS  Only take over-the-counter or prescription medicines for pain, discomfort, or fever as directed by your caregiver.  Use a cool mist humidifier to make breathing easier.  Get plenty of rest until your temperature returns to normal. This usually takes 3 to 4 days.  Drink enough fluids to keep your urine clear or pale yellow.  Cover your mouth and nose when coughing or sneezing, and wash your hands well to avoid spreading the virus.  Stay home  from work or school until your fever has been gone for at least 1 full day. SEEK MEDICAL CARE IF:   You have chest pain or a deep cough that worsens or produces more mucus.  You have nausea, vomiting, or diarrhea. SEEK IMMEDIATE MEDICAL CARE IF:   You have difficulty breathing, shortness of breath, or your skin or nails turn bluish.  You have severe neck pain or stiffness.  You have a severe headache, facial pain, or earache.  You have a worsening or recurring fever.  You have nausea or vomiting that cannot be controlled. MAKE SURE YOU:  Understand these instructions.  Will watch your condition.  Will get help right away if you are not doing well or get worse. Document Released: 07/26/2000 Document Revised: 01/28/2012 Document Reviewed: 10/28/2011 [facility] Patient Information 2013 [place], [place].      Neta Mends. Panosh M.D.

## 2012-07-21 NOTE — Patient Instructions (Addendum)
This acts like influenza  Fever can last up to 5 days  Take the tamiflu contact us if fever not gon in another 48 hours or so of other worsening. No work until fever gone for 24 hours .  Reschedule the pap appt.   Influenza    Influenza Facts Flu (influenza) is a contagious respiratory illness caused by the influenza viruses. It can cause mild to severe illness. While most healthy people recover from the flu without specific treatment and without complications, older people, young children, and people with certain health conditions are at higher risk for serious complications from the flu, including death. CAUSES   The flu virus is spread from person to person by respiratory droplets from coughing and sneezing.  A person can also become infected by touching an object or surface with a virus on it and then touching their mouth, eye or nose.  Adults may be able to infect others from 1 day before symptoms occur and up to 7 days after getting sick. So it is possible to give someone the flu even before you know you are sick and continue to infect others while you are sick. SYMPTOMS   Fever (usually high).  Headache.  Tiredness (can be extreme).  Cough.  Sore throat.  Runny or stuffy nose.  Body aches.  Diarrhea and vomiting may also occur, particularly in children.  These symptoms are referred to as "flu-like symptoms". A lot of different illnesses, including the common cold, can have similar symptoms. DIAGNOSIS   There are tests that can determine if you have the flu as long you are tested within the first 2 or 3 days of illness.  A doctor's exam and additional tests may be needed to identify if you have a disease that is a complicating the flu. RISKS AND COMPLICATIONS  Some of the complications caused by the flu include:  Bacterial pneumonia or progressive pneumonia caused by the flu virus.  Loss of body fluids (dehydration).  Worsening of chronic medical conditions,  such as heart failure, asthma, or diabetes.  Sinus problems and ear infections. HOME CARE INSTRUCTIONS   Seek medical care early on.  If you are at high risk from complications of the flu, consult your health-care provider as soon as you develop flu-like symptoms. Those at high risk for complications include:  People 65 years or older.  People with chronic medical conditions, including diabetes.  Pregnant women.  Young children.  Your caregiver may recommend use of an antiviral medication to help treat the flu.  If you get the flu, get plenty of rest, drink a lot of liquids, and avoid using alcohol and tobacco.  You can take over-the-counter medications to relieve the symptoms of the flu if your caregiver approves. (Never give aspirin to children or teenagers who have flu-like symptoms, particularly fever). PREVENTION  The single best way to prevent the flu is to get a flu vaccine each fall. Other measures that can help protect against the flu are:  Antiviral Medications  A number of antiviral drugs are approved for use in preventing the flu. These are prescription medications, and a doctor should be consulted before they are used.  Habits for Good Health  Cover your nose and mouth with a tissue when you cough or sneeze, throw the tissue away after you use it.  Wash your hands often with soap and water, especially after you cough or sneeze. If you are not near water, use an alcohol-based hand cleaner.  Avoid people  who are sick.  If you get the flu, stay home from work or school. Avoid contact with other people so that you do not make them sick, too.  Try not to touch your eyes, nose, or mouth as germs ore often spread this way. IN CHILDREN, EMERGENCY WARNING SIGNS THAT NEED URGENT MEDICAL ATTENTION:  Fast breathing or trouble breathing.  Bluish skin color.  Not drinking enough fluids.  Not waking up or not interacting.  Being so irritable that the child does not  want to be held.  Flu-like symptoms improve but then return with fever and worse cough.  Fever with a rash. IN ADULTS, EMERGENCY WARNING SIGNS THAT NEED URGENT MEDICAL ATTENTION:  Difficulty breathing or shortness of breath.  Pain or pressure in the chest or abdomen.  Sudden dizziness.  Confusion.  Severe or persistent vomiting. SEEK IMMEDIATE MEDICAL CARE IF:  You or someone you know is experiencing any of the symptoms above. When you arrive at the emergency center,report that you think you have the flu. You may be asked to wear a mask and/or sit in a secluded area to protect others from getting sick. MAKE SURE YOU:   Understand these instructions.  Monitor your condition.  Seek medical care if you are getting worse, or not improving. Document Released: 08/01/2003 Document Revised: 10/21/2011 Document Reviewed: 04/27/2009 [facility] Patient Information 2013 [place], [place]. Influenza, Adult Influenza ("the flu") is a viral infection of the respiratory tract. It occurs more often in winter months because people spend more time in close contact with one another. Influenza can make you feel very sick. Influenza easily spreads from person to person (contagious). CAUSES  Influenza is caused by a virus that infects the respiratory tract. You can catch the virus by breathing in droplets from an infected person's cough or sneeze. You can also catch the virus by touching something that was recently contaminated with the virus and then touching your mouth, nose, or eyes. SYMPTOMS  Symptoms typically last 4 to 10 days and may include:  Fever.  Chills.  Headache, body aches, and muscle aches.  Sore throat.  Chest discomfort and cough.  Poor appetite.  Weakness or feeling tired.  Dizziness.  Nausea or vomiting. DIAGNOSIS  Diagnosis of influenza is often made based on your history and a physical exam. A nose or throat swab test can be done to confirm the diagnosis. RISKS AND  COMPLICATIONS You may be at risk for a more severe case of influenza if you smoke cigarettes, have diabetes, have chronic heart disease (such as heart failure) or lung disease (such as asthma), or if you have a weakened immune system. Elderly people and pregnant women are also at risk for more serious infections. The most common complication of influenza is a lung infection (pneumonia). Sometimes, this complication can require emergency medical care and may be life-threatening. PREVENTION  An annual influenza vaccination (flu shot) is the best way to avoid getting influenza. An annual flu shot is now routinely recommended for all adults in the [place] TREATMENT  In mild cases, influenza goes away on its own. Treatment is directed at relieving symptoms. For more severe cases, your caregiver may prescribe antiviral medicines to shorten the sickness. Antibiotic medicines are not effective, because the infection is caused by a virus, not by bacteria. HOME CARE INSTRUCTIONS  Only take over-the-counter or prescription medicines for pain, discomfort, or fever as directed by your caregiver.  Use a cool mist humidifier to make breathing easier.  Get plenty of  rest until your temperature returns to normal. This usually takes 3 to 4 days.  Drink enough fluids to keep your urine clear or pale yellow.  Cover your mouth and nose when coughing or sneezing, and wash your hands well to avoid spreading the virus.  Stay home from work or school until your fever has been gone for at least 1 full day. SEEK MEDICAL CARE IF:   You have chest pain or a deep cough that worsens or produces more mucus.  You have nausea, vomiting, or diarrhea. SEEK IMMEDIATE MEDICAL CARE IF:   You have difficulty breathing, shortness of breath, or your skin or nails turn bluish.  You have severe neck pain or stiffness.  You have a severe headache, facial pain, or earache.  You have a worsening or recurring fever.  You have nausea  or vomiting that cannot be controlled. MAKE SURE YOU:  Understand these instructions.  Will watch your condition.  Will get help right away if you are not doing well or get worse. Document Released: 07/26/2000 Document Revised: 01/28/2012 Document Reviewed: 10/28/2011 [facility] Patient Information 2013 [place], [place].

## 2012-09-14 ENCOUNTER — Ambulatory Visit (INDEPENDENT_AMBULATORY_CARE_PROVIDER_SITE_OTHER): Payer: 59 | Admitting: Family Medicine

## 2012-09-14 DIAGNOSIS — Z309 Encounter for contraceptive management, unspecified: Secondary | ICD-10-CM

## 2012-09-14 MED ORDER — MEDROXYPROGESTERONE ACETATE 150 MG/ML IM SUSP
150.0000 mg | Freq: Once | INTRAMUSCULAR | Status: AC
  Administered 2012-09-14: 150 mg via INTRAMUSCULAR

## 2012-09-14 MED ORDER — MEDROXYPROGESTERONE ACETATE 150 MG/ML IM SUSP: 150.0000 mg | Freq: Once | INTRAMUSCULAR | Status: DC

## 2012-09-21 ENCOUNTER — Encounter: Payer: Self-pay | Admitting: Internal Medicine

## 2012-09-21 ENCOUNTER — Other Ambulatory Visit (HOSPITAL_COMMUNITY)
Admission: RE | Admit: 2012-09-21 | Discharge: 2012-09-21 | Disposition: A | Discharge status: Home / Self Care | Payer: 59 | Source: Ambulatory Visit | Attending: Internal Medicine | Admitting: Internal Medicine

## 2012-09-21 ENCOUNTER — Encounter: Payer: 59 | Admitting: Internal Medicine

## 2012-09-21 ENCOUNTER — Ambulatory Visit (INDEPENDENT_AMBULATORY_CARE_PROVIDER_SITE_OTHER): Payer: 59 | Admitting: Internal Medicine

## 2012-09-21 VITALS — BP 110/74 | HR 87 | Temp 98.6°F | Wt 134.0 lb

## 2012-09-21 DIAGNOSIS — R6889 Other general symptoms and signs: Secondary | ICD-10-CM

## 2012-09-21 DIAGNOSIS — Z0289 Encounter for other administrative examinations: Secondary | ICD-10-CM

## 2012-09-21 DIAGNOSIS — Z01419 Encounter for gynecological examination (general) (routine) without abnormal findings: Secondary | ICD-10-CM | POA: Insufficient documentation

## 2012-09-21 NOTE — Addendum Note (Signed)
Addended by: Raj Janus T on: 09/21/2012 04:46 PM   Modules accepted: Orders

## 2012-09-21 NOTE — Progress Notes (Signed)
No chief complaint on file.   HPI: Here for fu of abnormal pap  Ascus  ROS: See pertinent positives and negatives per HPI.  Past Medical History  Diagnosis Date  . History of chicken pox   . Osgood-Schlatter's disease     hx  . Screening for chlamydial disease 09/2008    pos test  . Seasonal allergies   . Allergic rhinitis     Family History  Problem Relation Age of Onset  . Other Mother     large ASD declining surgical intervention    History   Social History  . Marital Status: Single    Spouse Name: N/A    Number of Children: N/A  . Years of Education: N/A   Social History Main Topics  . Smoking status: Never Smoker   . Smokeless tobacco: None  . Alcohol Use: No  . Drug Use: No  . Sexually Active: None   Other Topics Concern  . 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 interested in obgynearea but may do PT assistant    No exercise except work.   As off because of work schedule.   Negative TAD   Avoids milk     Outpatient Encounter Prescriptions as of 09/21/2012  Medication Sig Dispense Refill  . medroxyPROGESTERone (DEPO-PROVERA) 150 MG/ML injection Inject 150 mg into the muscle every 3 (three) months.        Marland Kitchen oseltamivir (TAMIFLU) 75 MG capsule Take 1 capsule (75 mg total) by mouth 2 (two) times daily.  10 capsule  0   No facility-administered encounter medications on file as of 09/21/2012.    EXAM:  There were no vitals taken for this visit.  There is no weight on file to calculate BMI.  GENERAL: vitals reviewed and listed above, alert, oriented, appears well hydrated and in no acute distress  HEENT: atraumatic, conjunctiva  clear, no obvious abnormalities on inspection of external nose and ears OP : no lesion edema or exudate   NECK: no obvious masses on inspection palpation   LUNGS: clear to auscultation bilaterally, no wheezes,  rales or rhonchi, good air movement  CV: HRRR, no clubbing cyanosis or  peripheral edema nl cap refill   MS: moves all extremities without noticeable focal  abnormality  PSYCH: pleasant and cooperative, no obvious depression or anxiety  ASSESSMENT AND PLAN:  Discussed the following assessment and plan:  No diagnosis found.  -Patient advised to return or notify health care team  if symptoms worsen or persist or new concerns arise.  There are no Patient Instructions on file for this visit.   Neta Mends. Panosh M.D.

## 2012-09-21 NOTE — Patient Instructions (Signed)
Your last pap showed atypical cells of unknown significance  Of ASCUS   And the high risk HPV screen  Was positive .   If your PAP is normal this time we would repeat in 1 year . If abnormal get gyne to see  You.  colposcopy is a procedure that is a magnified view of the cervix .   Abnormal Pap Test Information During a Pap test, the cells on the surface of your cervix are checked to see if they look normal, abnormal, or if they show signs of having been altered by a certain type of virus called human papillomavirus, or HPV. Cervical cells that have been affected by HPV are called dysplasia. Dysplasia is not cancer, but describes abnormal cells found on the surface of the cervix. Depending on the degree of dysplasia, some of the cells may be considered pre-cancerous and may turn into cancer over time if follow up with a caregiver is delayed.  WHAT DOES AN ABNORMAL PAP TEST MEAN? Having an abnormal pap test does not mean that you have cancer. However, certain types of abnormal pap tests can be a sign that a person is at a higher risk of developing cancer. Your caregiver will want to do other tests to find out more about the abnormal cells. Your abnormal Pap test results could show:   Small and uncertain changes that should be carefully watched.   Cervical dysplasia that has caused mild changes and can be followed over time.  Cervical dysplasia that is more severe and needs to be followed and treated to ensure the problem goes away.  Cancer.  When severe cervical dysplasia is found and treated early, it rarely will grow into cancer.  WHAT WILL BE DONE ABOUT MY ABNORMAL PAP TEST?  A colposcopy may be needed. This is a procedure where your cervix is examined using light and magnification.  A small tissue sample of your cervix (biopsy) may need to be removed and then examined. This is often performed if there are areas that appear infected.  A sample of cells from the cervical canal may be  removed with either a small brush or scraping instrument (curette). Based on the results of the procedures above, some caregivers may recommend either cryotherapy of the cervix or a surgical LEEP where a portion of the cervix is removed. LEEP is short for "loop electrical excisional procedure." Rarely, a caregiver may recommend a cone biopsy.This is a procedure where a small, cone-shaped sample of your cervix is taken out. The part that is taken out is the area where the abnormal cells are.  WHAT IF I HAVE A DYSPLASIA OR A CANCER? You may be referred to a specialist. Radiation may also be a treatment for more advanced cancer. Having a hysterectomy is the last treatment option for dysplasia, but it is a more common treatment for someone with cancer. All treatment options will be discussed with you by your caregiver. WHAT SHOULD YOU DO AFTER BEING TREATED? If you have had an abnormal pap test, you should continue to have regular pap tests and check-ups as directed by your caregiver. Your cervical problem will be carefully watched so it does not get worse. Also, your caregiver can watch for, and treat, any new problems that may come up. Document Released: 11/13/2010 Document Revised: 10/21/2011 Document Reviewed: 07/25/2011 [facility] Patient Information 2013 [place], [place].

## 2012-09-21 NOTE — Progress Notes (Signed)
Chief Complaint  Patient presents with  . Abnormal Pap Smear    fu     HPI:  Pt here for repeat pap from last check. Last PAP in [place] 2013  Ascus with HPV positive screen  Neg sti check   No gyne sx now  The dark spot in vaginal area went away! Still on depo no concerns  ROS: See pertinent positives and negatives per HPI.  Past Medical History  Diagnosis Date  . History of chicken pox   . Osgood-Schlatter's disease     hx  . Screening for chlamydial disease 09/2008    pos test  . Seasonal allergies   . Allergic rhinitis   . ASCUS (atypical squamous cells of undetermined significance) on Pap smear 01/08/2012    hpv pos     Family History  Problem Relation Age of Onset  . Other Mother     large ASD declining surgical intervention    History   Social History  . Marital Status: Single    Spouse Name: N/A    Number of Children: N/A  . Years of Education: N/A   Social History Main Topics  . Smoking status: Never Smoker   . Smokeless tobacco: None  . Alcohol Use: No  . Drug Use: No  . Sexually Active: None   Other Topics Concern  . 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 interested in obgynearea but may do PT assistant    No exercise except work.   As off because of work schedule.   Negative TAD   Avoids milk     Outpatient Encounter Prescriptions as of 09/21/2012  Medication Sig Dispense Refill  . medroxyPROGESTERone (DEPO-PROVERA) 150 MG/ML injection Inject 150 mg into the muscle every 3 (three) months.        . [DISCONTINUED] oseltamivir (TAMIFLU) 75 MG capsule Take 1 capsule (75 mg total) by mouth 2 (two) times daily.  10 capsule  0   No facility-administered encounter medications on file as of 09/21/2012.    EXAM:  BP 110/74  Pulse 87  Temp(Src) 98.6 F (37 C) (Oral)  Wt 134 lb (60.782 kg)  BMI 22.99 kg/m2  SpO2 98%  Body mass index  is 22.99 kg/(m^2).  GENERAL: vitals reviewed and listed above, alert, oriented, appears well hydrated and in no acute distress Ext GU :  gu   Dark area gone!   Pink mucosa  cx os clear pap done no ectopy    No bleeding .  PSYCH: pleasant and cooperative, no obvious depression or anxiety  ASSESSMENT AND PLAN:  Discussed the following assessment and plan:  1. ASCUS (atypical squamous cells of undetermined significance) on Pap smear  HPV High risk Positive     repat today  per guidlines if ok then yearly x 2 if positive refer.    Disc  Plan and   New guidelines   And fu .  -Patient advised to return or notify health care team  if symptoms worsen or persist or new concerns arise.  Patient Instructions  Your last pap showed atypical cells of unknown significance  Of ASCUS   And the high risk HPV screen  Was positive .   If your PAP is normal this time we would repeat in 1 year . If abnormal get gyne to see  You.  colposcopy is a procedure that is a magnified view of the cervix .   Abnormal Pap Test Information During a Pap test, the cells on the surface of your cervix are checked to see if they look normal, abnormal, or if they show signs of having been altered by a certain type of virus called human papillomavirus, or HPV. Cervical cells that have been affected by HPV are called dysplasia. Dysplasia is not cancer, but describes abnormal cells found on the surface of the cervix. Depending on the degree of dysplasia, some of the cells may be considered pre-cancerous and may turn into cancer over time if follow up with a caregiver is delayed.  WHAT DOES AN ABNORMAL PAP TEST MEAN? Having an abnormal pap test does not mean that you have cancer. However, certain types of abnormal pap tests can be a sign that a person is at a higher risk of developing cancer. Your caregiver will want to do other tests to find out more about the abnormal cells. Your abnormal Pap test results could show:   Small and  uncertain changes that should be carefully watched.   Cervical dysplasia that has caused mild changes and can be followed over time.  Cervical dysplasia that is more severe and needs to be followed and treated to ensure the problem goes away.  Cancer.  When severe cervical dysplasia is found and treated early, it rarely will grow into cancer.  WHAT WILL BE DONE ABOUT MY ABNORMAL PAP TEST?  A colposcopy may be needed. This is a procedure where your cervix is examined using light and magnification.  A small tissue sample of your cervix (biopsy) may need to be removed and then examined. This is often performed if there are areas that appear infected.  A sample of cells from the cervical canal may be removed with either a small brush or scraping instrument (curette). Based on the results of the procedures above, some caregivers may recommend either cryotherapy of the cervix or a surgical LEEP where a portion of the cervix is removed. LEEP is short for "loop electrical excisional procedure." Rarely, a caregiver may recommend a cone biopsy.This is a procedure where a small, cone-shaped sample of your cervix is taken out. The part that is taken out is the area where the abnormal cells are.  WHAT IF I HAVE A DYSPLASIA OR A CANCER? You may be referred to a specialist. Radiation may also be a treatment for more advanced cancer. Having a hysterectomy is the last treatment option for dysplasia, but it is a more common treatment for someone with cancer. All treatment options will be discussed with you by your caregiver. WHAT SHOULD YOU DO AFTER BEING TREATED? If you have had an abnormal pap test, you should continue to have regular pap tests and check-ups as directed by your caregiver. Your cervical problem will be carefully watched so it does not get worse. Also, your caregiver can watch for, and treat, any new problems that may come up. Document Released: 11/13/2010 Document Revised: 10/21/2011  Document Reviewed: 07/25/2011 [facility] Patient Information 2013 [place], [place].   Neta Mends. Panosh M.D.

## 2012-10-05 ENCOUNTER — Other Ambulatory Visit: Payer: Self-pay | Admitting: Family Medicine

## 2012-10-05 DIAGNOSIS — IMO0002 Reserved for concepts with insufficient information to code with codable children: Secondary | ICD-10-CM

## 2012-12-07 ENCOUNTER — Ambulatory Visit (INDEPENDENT_AMBULATORY_CARE_PROVIDER_SITE_OTHER): Payer: 59 | Admitting: Family Medicine

## 2012-12-07 ENCOUNTER — Ambulatory Visit: Payer: 59 | Admitting: Family Medicine

## 2012-12-07 DIAGNOSIS — Z309 Encounter for contraceptive management, unspecified: Secondary | ICD-10-CM

## 2012-12-07 MED ORDER — MEDROXYPROGESTERONE ACETATE 150 MG/ML IM SUSP
150.0000 mg | Freq: Once | INTRAMUSCULAR | Status: AC
  Administered 2012-12-07: 150 mg via INTRAMUSCULAR

## 2013-03-01 ENCOUNTER — Ambulatory Visit (INDEPENDENT_AMBULATORY_CARE_PROVIDER_SITE_OTHER): Payer: 59 | Admitting: Family Medicine

## 2013-03-01 DIAGNOSIS — Z309 Encounter for contraceptive management, unspecified: Secondary | ICD-10-CM

## 2013-03-01 MED ORDER — MEDROXYPROGESTERONE ACETATE 150 MG/ML IM SUSP
150.0000 mg | Freq: Once | INTRAMUSCULAR | Status: AC
  Administered 2013-03-01: 150 mg via INTRAMUSCULAR

## 2013-05-26 ENCOUNTER — Ambulatory Visit: Payer: 59 | Admitting: Family Medicine

## 2013-10-28 ENCOUNTER — Telehealth: Payer: Self-pay

## 2013-10-28 NOTE — Telephone Encounter (Signed)
Pt would like nurse to call her concerning depo shot

## 2013-10-29 NOTE — Telephone Encounter (Signed)
Pt returned by call and left a message on my machine.  Tried to call her back and received a message that she has a voice mail box that has not been set up yet.  Will try again at a later time.

## 2013-10-29 NOTE — Telephone Encounter (Signed)
Tried returning the patient's call.  Received a message that she does not have voicemail that has been set up.  Will try again at a later time.

## 2013-11-02 ENCOUNTER — Telehealth: Payer: Self-pay | Admitting: Internal Medicine

## 2013-11-02 NOTE — Telephone Encounter (Signed)
Pt returning your call

## 2013-11-02 NOTE — Telephone Encounter (Signed)
See other note

## 2013-11-02 NOTE — Telephone Encounter (Signed)
Tried calling the pt.  Received a message that her voicemail box has not been set up yet.  Will try again later.

## 2013-11-02 NOTE — Telephone Encounter (Signed)
Could take 6+ months to get periods back  .   She should do a pregnancy test.    If no periods in the next 3 months  Advise see OBGYNE or can be seen here also

## 2013-11-02 NOTE — Telephone Encounter (Signed)
Spoke to the pt.  She was on depo provera for 4 years.  Stopped taking it in Oct.  Is currently trying to become pregnant.  Pt is concerned because she is not having periods.  Would also like to know how long it should take before she is able to become pregnant.  Please advise.  Thanks!!

## 2013-11-03 NOTE — Telephone Encounter (Signed)
Pt notified.  Will call back if problem persists and will help her get set up with OB/GYN.

## 2013-11-25 ENCOUNTER — Emergency Department (HOSPITAL_COMMUNITY): Payer: 59

## 2013-11-25 ENCOUNTER — Encounter (HOSPITAL_COMMUNITY): Payer: Self-pay | Admitting: Emergency Medicine

## 2013-11-25 ENCOUNTER — Emergency Department (HOSPITAL_COMMUNITY)
Admission: EM | Admit: 2013-11-25 | Discharge: 2013-11-26 | Disposition: A | Discharge status: Home / Self Care | Payer: 59 | Attending: Emergency Medicine | Admitting: Emergency Medicine

## 2013-11-25 DIAGNOSIS — M549 Dorsalgia, unspecified: Secondary | ICD-10-CM

## 2013-11-25 DIAGNOSIS — R110 Nausea: Secondary | ICD-10-CM | POA: Insufficient documentation

## 2013-11-25 DIAGNOSIS — M545 Low back pain, unspecified: Secondary | ICD-10-CM | POA: Insufficient documentation

## 2013-11-25 DIAGNOSIS — Z8619 Personal history of other infectious and parasitic diseases: Secondary | ICD-10-CM | POA: Insufficient documentation

## 2013-11-25 DIAGNOSIS — R109 Unspecified abdominal pain: Secondary | ICD-10-CM | POA: Insufficient documentation

## 2013-11-25 DIAGNOSIS — R6889 Other general symptoms and signs: Secondary | ICD-10-CM | POA: Insufficient documentation

## 2013-11-25 DIAGNOSIS — J309 Allergic rhinitis, unspecified: Secondary | ICD-10-CM | POA: Insufficient documentation

## 2013-11-25 DIAGNOSIS — M928 Other specified juvenile osteochondrosis: Secondary | ICD-10-CM | POA: Insufficient documentation

## 2013-11-25 LAB — CBC
HEMATOCRIT: 39.3 % (ref 36.0–46.0)
HEMOGLOBIN: 12.5 g/dL (ref 12.0–15.0)
MCH: 27.5 pg (ref 26.0–34.0)
MCHC: 31.8 g/dL (ref 30.0–36.0)
MCV: 86.4 fL (ref 78.0–100.0)
Platelets: 272 10*3/uL (ref 150–400)
RBC: 4.55 MIL/uL (ref 3.87–5.11)
RDW: 13.5 % (ref 11.5–15.5)
WBC: 3.7 10*3/uL — ABNORMAL LOW (ref 4.0–10.5)

## 2013-11-25 LAB — BASIC METABOLIC PANEL
BUN: 11 mg/dL (ref 6–23)
CALCIUM: 9 mg/dL (ref 8.4–10.5)
CO2: 23 meq/L (ref 19–32)
Chloride: 103 mEq/L (ref 96–112)
Creatinine, Ser: 0.71 mg/dL (ref 0.50–1.10)
GFR calc Af Amer: >90 mL/min (ref >90)
Glucose, Bld: 93 mg/dL (ref 70–99)
Potassium: 3.8 mEq/L (ref 3.7–5.3)
SODIUM: 141 meq/L (ref 137–147)

## 2013-11-25 LAB — I-STAT TROPONIN, ED: Troponin i, poc: 0 ng/mL (ref 0.00–0.08)

## 2013-11-25 NOTE — ED Notes (Signed)
States shes had severe back, chest and abd pain and "Feeling warm" for past week

## 2013-11-26 LAB — URINE MICROSCOPIC-ADD ON

## 2013-11-26 LAB — POC URINE PREG, ED: PREG TEST UR: NEGATIVE

## 2013-11-26 LAB — URINALYSIS, ROUTINE W REFLEX MICROSCOPIC
BILIRUBIN URINE: NEGATIVE
Glucose, UA: NEGATIVE mg/dL
Ketones, ur: NEGATIVE mg/dL
Leukocytes, UA: NEGATIVE
Nitrite: NEGATIVE
PH: 5.5 (ref 5.0–8.0)
Protein, ur: NEGATIVE mg/dL
SPECIFIC GRAVITY, URINE: 1.014 (ref 1.005–1.030)
Urobilinogen, UA: 0.2 mg/dL (ref 0.0–1.0)

## 2013-11-26 MED ORDER — CYCLOBENZAPRINE HCL 10 MG PO TABS: 10.0000 mg | ORAL_TABLET | Freq: Two times a day (BID) | ORAL | Status: DC | PRN

## 2013-11-26 MED ORDER — TRAMADOL HCL 50 MG PO TABS: 50.0000 mg | ORAL_TABLET | Freq: Four times a day (QID) | ORAL | Status: DC | PRN

## 2013-11-26 MED ORDER — TRAMADOL HCL 50 MG PO TABS
50.0000 mg | ORAL_TABLET | Freq: Once | ORAL | Status: AC
  Administered 2013-11-26: 50 mg via ORAL
  Filled 2013-11-26: qty 1

## 2013-11-26 MED ORDER — IBUPROFEN 600 MG PO TABS: 600.0000 mg | ORAL_TABLET | Freq: Four times a day (QID) | ORAL | Status: DC | PRN

## 2013-11-26 NOTE — ED Provider Notes (Signed)
CSN: [id number]     Arrival date & time 11/25/13  1851 History   First MD Initiated Contact with Patient 11/25/13 2352     Chief Complaint  Patient presents with  . Chest Pain     (Consider location/radiation/quality/duration/timing/severity/associated sxs/prior Treatment) HPI  Patient is a 23 yo woman in generally good health. She presents with complaints of diffuse back, chest and abdominal pain for the past week. No inciting event. Back pain began first. Then, chest and abdominal pain. Patient says she also has a headache. Patient notes fleeting episodes of nausea. Her back pain sometimes interferes with her daily activites. No radiation of back pain and no paresthesias or motor weakness.   Overall, pain rates moderately severe. NOthing seems to make it worse or better. No history of same.   Past Medical History  Diagnosis Date  . History of chicken pox   . Osgood-Schlatter's disease     hx  . Screening for chlamydial disease 09/2008    pos test  . Seasonal allergies   . Allergic rhinitis   . ASCUS (atypical squamous cells of undetermined significance) on Pap smear 01/08/2012    hpv pos    History reviewed. No pertinent past surgical history. Family History  Problem Relation Age of Onset  . Other Mother     large ASD declining surgical intervention   History  Substance Use Topics  . Smoking status: Never Smoker   . Smokeless tobacco: Not on file  . Alcohol Use: No   OB History   Grav Para Term Preterm Abortions TAB SAB Ect Mult Living                 Review of Systems Ten point review of symptoms performed and is negative with the exception of symptoms noted above.     Allergies  Review of patient's allergies indicates no known allergies.  Home Medications   Prior to Admission medications   Medication Sig Start Date End Date Taking? Authorizing Provider  medroxyPROGESTERone (DEPO-PROVERA) 150 MG/ML injection Inject 150 mg into the muscle every 3 (three)  months.     Yes Historical Provider, MD   BP 105/60  Pulse 58  Temp(Src) 98.3 F (36.8 C) (Oral)  Resp 16  Ht 5\' 3"  (1.6 m)  SpO2 100%  LMP 11/10/2013 Physical Exam Gen: well developed and well nourished appearing Head: NCAT Eyes: PERL, EOMI Nose: no epistaixis or rhinorrhea Mouth/throat: mucosa is moist and pink Neck: supple, no stridor Chest wall: no ttp Lungs: CTA B, no wheezing, rhonchi or rales CV: RRR, no murmur, extremities appear well perfused.  Abd: soft, notender, nondistended Back: no ttp, no cva ttp, mild ttp over paraspinal musculature left side lumbar region.  Skin: warm and dry Ext: normal to inspection, no dependent edema Neuro: CN ii-xii grossly intact, no focal deficits, normal gait, normal speech Psyche; normal affect,  calm and cooperative.   ED Course  Procedures (including critical care time) Labs Revie  Results for orders placed during the hospital encounter of 11/25/13 (from the past 24 hour(s))  CBC     Status: Abnormal   Collection Time    11/25/13  7:00 PM      Result Value Ref Range   WBC 3.7 (*) 4.0 - 10.5 K/uL   RBC 4.55  3.87 - 5.11 MIL/uL   Hemoglobin 12.5  12.0 - 15.0 g/dL   HCT 30.839.3  65.736.0 - 84.646.0 %   MCV 86.4  78.0 - 100.0 fL  MCH 27.5  26.0 - 34.0 pg   MCHC 31.8  30.0 - 36.0 g/dL   RDW 16.113.5  09.611.5 - 04.515.5 %   Platelets 272  150 - 400 K/uL  BASIC METABOLIC PANEL     Status: None   Collection Time    11/25/13  7:00 PM      Result Value Ref Range   Sodium 141  137 - 147 mEq/L   Potassium 3.8  3.7 - 5.3 mEq/L   Chloride 103  96 - 112 mEq/L   CO2 23  19 - 32 mEq/L   Glucose, Bld 93  70 - 99 mg/dL   BUN 11  6 - 23 mg/dL   Creatinine, Ser 4.090.71  0.50 - 1.10 mg/dL   Calcium 9.0  8.4 - 81.110.5 mg/dL   GFR calc non Af Amer >90  >90 mL/min   GFR calc Af Amer >90  >90 mL/min  I-STAT TROPOININ, ED     Status: None   Collection Time    11/25/13  7:23 PM      Result Value Ref Range   Troponin i, poc 0.00  0.00 - 0.08 ng/mL   Comment 3                 Imaging Review Dg Chest 2 View  11/25/2013   CLINICAL DATA:  CHEST PAIN  EXAM: CHEST  2 VIEW  COMPARISON:  DG CHEST 2 VIEW dated 05/23/2010  FINDINGS: The heart size and mediastinal contours are within normal limits. Both lungs are clear. The visualized skeletal structures are unremarkable.  IMPRESSION: No active cardiopulmonary disease.   Electronically Signed   By: Salome HolmesHector  Cooper M.D.   On: 11/25/2013 20:26    EKG: nsr, no acute ischemic changes, normal intervals, normal axis, normal qrs complex  MDM   No organic cause of pain identified other than myofascial ttp in the left lumbar region. CXR, EKG, BMP wnl. CBC notable for WBC of 3.7. I have informed the patient that she should have a repeat CBC with her PCP in 2 weeks. The patient has been evaluated for a life threatening or emergent condition and is stable for discharge with plan for symptomatic management and outpatient f/u.     Brandt LoosenJulie Manly, MD 11/26/13 [id number]

## 2013-11-26 NOTE — Discharge Instructions (Signed)
Back Pain, Adult Low back pain is very common. About 1 in 5 people have back pain.The cause of low back pain is rarely dangerous. The pain often gets better over time.About half of people with a sudden onset of back pain feel better in just 2 weeks. About 8 in 10 people feel better by 6 weeks.  CAUSES Some common causes of back pain include:  Strain of the muscles or ligaments supporting the spine.  Wear and tear (degeneration) of the spinal discs.  Arthritis.  Direct injury to the back. DIAGNOSIS Most of the time, the direct cause of low back pain is not known.However, back pain can be treated effectively even when the exact cause of the pain is unknown.Answering your caregiver's questions about your overall health and symptoms is one of the most accurate ways to make sure the cause of your pain is not dangerous. If your caregiver needs more information, he or she may order lab work or imaging tests (X-rays or MRIs).However, even if imaging tests show changes in your back, this usually does not require surgery. HOME CARE INSTRUCTIONS For many people, back pain returns.Since low back pain is rarely dangerous, it is often a condition that people can learn to manageon their own.   Remain active. It is stressful on the back to sit or stand in one place. Do not sit, drive, or stand in one place for more than 30 minutes at a time. Take short walks on level surfaces as soon as pain allows.Try to increase the length of time you walk each day.  Do not stay in bed.Resting more than 1 or 2 days can delay your recovery.  Do not avoid exercise or work.Your body is made to move.It is not dangerous to be active, even though your back may hurt.Your back will likely heal faster if you return to being active before your pain is gone.  Pay attention to your body when you bend and lift. Many people have less discomfortwhen lifting if they bend their knees, keep the load close to their bodies,and  avoid twisting. Often, the most comfortable positions are those that put less stress on your recovering back.  Find a comfortable position to sleep. Use a firm mattress and lie on your side with your knees slightly bent. If you lie on your back, put a pillow under your knees.  Only take over-the-counter or prescription medicines as directed by your caregiver. Over-the-counter medicines to reduce pain and inflammation are often the most helpful.Your caregiver may prescribe muscle relaxant drugs.These medicines help dull your pain so you can more quickly return to your normal activities and healthy exercise.  Put ice on the injured area.  Put ice in a plastic bag.  Place a towel between your skin and the bag.  Leave the ice on for 15-20 minutes, 03-04 times a day for the first 2 to 3 days. After that, ice and heat may be alternated to reduce pain and spasms.  Ask your caregiver about trying back exercises and gentle massage. This may be of some benefit.  Avoid feeling anxious or stressed.Stress increases muscle tension and can worsen back pain.It is important to recognize when you are anxious or stressed and learn ways to manage it.Exercise is a great option. SEEK MEDICAL CARE IF:  You have pain that is not relieved with rest or medicine.  You have pain that does not improve in 1 week.  You have new symptoms.  You are generally not feeling well. SEEK   IMMEDIATE MEDICAL CARE IF:   You have pain that radiates from your back into your legs.  You develop new bowel or bladder control problems.  You have unusual weakness or numbness in your arms or legs.  You develop nausea or vomiting.  You develop abdominal pain.  You feel faint. Document Released: 07/29/2005 Document Revised: 01/28/2012 Document Reviewed: 12/17/2010 ExitCare Patient Information 2014 [employer].  

## 2013-12-14 ENCOUNTER — Telehealth: Payer: Self-pay | Admitting: Internal Medicine

## 2013-12-14 NOTE — Telephone Encounter (Signed)
Spoke to the pt.  She informed me that she has taken 2 home pregnancy test.  One is positive and the other negative.  I asked if she wanted to come in for an appt and she stated she doesn't know if she is still covered under her mother's insurance.  I told her to speak to her mom today and find out if she is still covered.  If so, than she should call back and make an appt.  If not than she could go to the health department for verification of pregnancy.  Patient will call me back tomorrow.

## 2013-12-14 NOTE — Telephone Encounter (Signed)
Pt has question about depo shot would like a call back

## 2013-12-28 ENCOUNTER — Ambulatory Visit (INDEPENDENT_AMBULATORY_CARE_PROVIDER_SITE_OTHER): Payer: 59 | Admitting: *Deleted

## 2013-12-28 DIAGNOSIS — N926 Irregular menstruation, unspecified: Secondary | ICD-10-CM

## 2013-12-28 LAB — POCT PREGNANCY, URINE: Preg Test, Ur: NEGATIVE

## 2013-12-28 NOTE — Telephone Encounter (Signed)
Patient called back and has made an appt for 12/30/13.

## 2013-12-30 ENCOUNTER — Encounter: Payer: Self-pay | Admitting: Internal Medicine

## 2013-12-30 ENCOUNTER — Ambulatory Visit (INDEPENDENT_AMBULATORY_CARE_PROVIDER_SITE_OTHER): Payer: 59 | Admitting: Internal Medicine

## 2013-12-30 VITALS — BP 122/82 | Temp 98.4°F | Ht 64.0 in | Wt 128.0 lb

## 2013-12-30 DIAGNOSIS — N926 Irregular menstruation, unspecified: Secondary | ICD-10-CM

## 2013-12-30 DIAGNOSIS — R109 Unspecified abdominal pain: Secondary | ICD-10-CM

## 2013-12-30 DIAGNOSIS — Z3200 Encounter for pregnancy test, result unknown: Secondary | ICD-10-CM

## 2013-12-30 LAB — CBC WITH DIFFERENTIAL/PLATELET
BASOS ABS: 0 10*3/uL (ref 0.0–0.1)
Basophils Relative: 0.5 % (ref 0.0–3.0)
Eosinophils Absolute: 0.1 10*3/uL (ref 0.0–0.7)
Eosinophils Relative: 1.2 % (ref 0.0–5.0)
HEMATOCRIT: 38.6 % (ref 36.0–46.0)
Hemoglobin: 12.7 g/dL (ref 12.0–15.0)
LYMPHS PCT: 25.9 % (ref 12.0–46.0)
Lymphs Abs: 1.3 10*3/uL (ref 0.7–4.0)
MCHC: 32.8 g/dL (ref 30.0–36.0)
MCV: 85.1 fl (ref 78.0–100.0)
MONO ABS: 0.5 10*3/uL (ref 0.1–1.0)
Monocytes Relative: 8.7 % (ref 3.0–12.0)
NEUTROS ABS: 3.3 10*3/uL (ref 1.4–7.7)
Neutrophils Relative %: 63.7 % (ref 43.0–77.0)
Platelets: 290 10*3/uL (ref 150.0–400.0)
RBC: 4.54 Mil/uL (ref 3.87–5.11)
RDW: 14 % (ref 11.5–15.5)
WBC: 5.2 10*3/uL (ref 4.0–10.5)

## 2013-12-30 LAB — BASIC METABOLIC PANEL
BUN: 11 mg/dL (ref 6–23)
CALCIUM: 9 mg/dL (ref 8.4–10.5)
CHLORIDE: 105 meq/L (ref 96–112)
CO2: 26 mEq/L (ref 19–32)
CREATININE: 0.8 mg/dL (ref 0.4–1.2)
GFR: 120.54 mL/min (ref >60.00)
Glucose, Bld: 84 mg/dL (ref 70–99)
Potassium: 4.1 mEq/L (ref 3.5–5.1)
Sodium: 139 mEq/L (ref 135–145)

## 2013-12-30 LAB — TSH: TSH: 0.47 u[IU]/mL (ref 0.35–4.50)

## 2013-12-30 LAB — POCT URINE PREGNANCY: Preg Test, Ur: NEGATIVE

## 2013-12-30 LAB — T4, FREE: Free T4: 0.92 ng/dL (ref 0.60–1.60)

## 2013-12-30 LAB — HCG, QUANTITATIVE, PREGNANCY: HCG, BETA CHAIN, QUANT, S: 1.73 m[IU]/mL

## 2013-12-30 LAB — SEDIMENTATION RATE: Sed Rate: 11 mm/hr (ref 0–22)

## 2013-12-30 NOTE — Patient Instructions (Addendum)
It may take a while to get normal periods back after depo. Will get blood tests to check thyroid etc today.  ? If you are due for pap smear    It appears that  You were seen in clinic by rn and didn't have  Gyne evalution.  And you have appt in JUne with the doctor.. Track your periods in the meantime Use ibuprofen 400 - 800 mg as needed every 6 hours for cramps and pain . Contact medical team if fever chills worsening .

## 2013-12-30 NOTE — Progress Notes (Signed)
Chief Complaint  Patient presents with  . Menstrual Problem    Patient has been off Depo Provera for 6+ months.  Try to conceive.  Is bleeding for the 4th time in one month.  Pregnancy test negative and seen ob/gyn on 12/28/13.    HPI: Patient comes in today for  new problem evaluation. See above  Was on depo for 3-4  Years  Reg   And not painful. When off about 6 months ago anticipating getting pregnant. However periods of nephrotic and she has had bleeding on April 1 10/16/2015 10/28/2021 30th and then again May 21 just started. preg test negative.  Has significant cramps with this and back pain with it without associated fever although feels hot. Periods pre-gadolinium seem to be regular and doesn't remember this kind of pain she is unsure when she's due for her next Pap smear but think she's due And hpt at home -1 positive [facility] was negative but didn't get any other information about her bleeding pain and questions. And not to miss for 2-3 years.  ROS: See pertinent positives and negatives per HPI. 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 History of chicken pox   . Osgood-Schlatter's disease     hx  . Screening for chlamydial disease 09/2008    pos test  . Seasonal allergies   . Allergic rhinitis   . ASCUS (atypical squamous cells of undetermined significance) on Pap smear 01/08/2012    hpv pos     Family History  Problem Relation Age of Onset  . Other Mother     large ASD declining surgical intervention    History   Social History  . Marital Status: Single    Spouse Name: N/A    Number of Children: N/A  . Years of Education: N/A   Social History Main Topics  . Smoking status: Never Smoker   . Smokeless tobacco: None  . Alcohol Use: No  . Drug Use: No  . Sexual Activity: None   Other Topics Concern  . 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   As off because of work schedule.   Negative TAD   Avoids milk     Outpatient Encounter Prescriptions as of 12/30/2013  Medication Sig  . [DISCONTINUED] cyclobenzaprine (FLEXERIL) 10 MG tablet Take 1 tablet (10 mg total) by mouth 2 (two) times daily as needed for muscle spasms.  . [DISCONTINUED] ibuprofen (ADVIL,MOTRIN) 600 MG tablet Take 1 tablet (600 mg total) by mouth every 6 (six) hours as needed.  . [DISCONTINUED] medroxyPROGESTERone (DEPO-PROVERA) 150 MG/ML injection Inject 150 mg into the muscle every 3 (three) months.    . [DISCONTINUED] traMADol (ULTRAM) 50 MG tablet Take 1 tablet (50 mg total) by mouth every 6 (six) hours as needed.    EXAM:  BP 122/82  Temp(Src) 98.4 F (36.9 C) (Oral)  Ht 5\' 4"  (1.626 m)  Wt 128 lb (58.06 kg)  BMI 21.96 kg/m2  Body mass index is 21.96 kg/(m^2).  GENERAL: vitals reviewed and listed above, alert, oriented, appears  well hydrated and in no acute distress HEENT: atraumatic, conjunctiva  clear, no obvious abnormalities on inspection of external nose and ears OP : no lesion edema or exudate  NECK: no obvious masses on inspection palpation no adenopathy supple LUNGS: clear to auscultation bilaterally, no wheezes, rales or rhonchi, good air movement CV: HRRR, no clubbing cyanosis or  peripheral edema nl cap refill  Abdomen:  Sof,t normal bowel sounds without hepatosplenomegaly, no guarding rebound or masses no CVA tenderness but does have some lower abdominal tenderness in both right and left gutters. No rebound MS: moves all extremities without noticeable focal  abnormality PSYCH: pleasant and cooperative, no obvious depression or anxiety  ASSESSMENT AND PLAN:  Discussed the following assessment and plan:  Irregular  menstrual cycle - Plan: TSH, T4, free, CBC with Differential, Basic metabolic panel, HCG, Quant, Pregnancy, Sedimentation rate, GC/Chlamydia Amp Probe, Urine  Encounter for pregnancy test - Plan: POCT urine pregnancy, TSH, T4, free, CBC with Differential, Basic metabolic panel, HCG, Quant, Pregnancy, Sedimentation rate, GC/Chlamydia Amp Probe, Urine  Abdominal pain with radiation to back - Plan: TSH, T4, free, CBC with Differential, Basic metabolic panel, HCG, Quant, Pregnancy, Sedimentation rate, GC/Chlamydia Amp Probe, Urine Looking at Epic record she does have an appointment with gynecology in June and it appears that she just had a quick visit for rule out pregnancy and on and evaluation last week. We'll check labs to include CBC thyroid tests and an hCG today urine for GC Chlamydia she's able to obtain it. She is on her period. monogomaous relationship but sh has had chl in the past . Declined hiv  testing today  I want to  make sure she gets followup with gynecology and may be due for Pap and getting her questions answered. Imipenem miscommunication about what was expected at the last visit. -Patient advised to return or notify health care team  if symptoms worsen ,persist or new concerns arise.  Patient Instructions  It may take a while to get normal periods back after depo. Will get blood tests to check thyroid etc today.  ? If you are due for pap smear    It appears that  You were seen in clinic by rn and didn't have  Gyne evalution.  And you have appt in JUne with the doctor.. Track your periods in the meantime Use ibuprofen 400 - 800 mg as needed every 6 hours for cramps and pain . Contact medical team if fever chills worsening .      Neta MendsWanda K. Edwyna Dangerfield M.D.  Pre visit review using our clinic review tool, if applicable. No additional management support is needed unless otherwise documented below in the visit note.

## 2013-12-31 LAB — GC/CHLAMYDIA PROBE AMP, URINE
Chlamydia, Swab/Urine, PCR: NEGATIVE
GC Probe Amp, Urine: NEGATIVE

## 2014-02-01 ENCOUNTER — Encounter (HOSPITAL_COMMUNITY): Payer: Self-pay

## 2014-02-01 ENCOUNTER — Inpatient Hospital Stay (HOSPITAL_COMMUNITY)
Admission: AD | Admit: 2014-02-01 | Discharge: 2014-02-02 | Disposition: A | Discharge status: Home / Self Care | Payer: 59 | Source: Ambulatory Visit | Attending: Obstetrics and Gynecology | Admitting: Obstetrics and Gynecology

## 2014-02-01 DIAGNOSIS — N939 Abnormal uterine and vaginal bleeding, unspecified: Secondary | ICD-10-CM

## 2014-02-01 DIAGNOSIS — N898 Other specified noninflammatory disorders of vagina: Secondary | ICD-10-CM | POA: Insufficient documentation

## 2014-02-01 DIAGNOSIS — M5380 Other specified dorsopathies, site unspecified: Secondary | ICD-10-CM | POA: Insufficient documentation

## 2014-02-01 HISTORY — DX: Unspecified abnormal cytological findings in specimens from vagina: R87.629

## 2014-02-01 LAB — POCT PREGNANCY, URINE: PREG TEST UR: NEGATIVE

## 2014-02-01 NOTE — MAU Provider Note (Signed)
Chief Complaint: Possible Pregnancy, Back Pain and Vaginal Bleeding   None    SUBJECTIVE HPI: Christine Rose is a 23 y.o. No obstetric history on file. at Unknown by LMP who presents with spotting yesterday and left lower back pain.   Positive HPT last week. LMP 5/10. Left lower back pain, constant, since noon today. Pink spotting since noon today; on toilet paper, wearing panty liner with no blood. Now last two times has gone to the bathroom has seen none. Denies abdominal pain. Denies urinary complaints. Vaginal discharge, normal. Denies vaginal irritation  No fevers, chills, nausea, vomiting, diarrhea, constipation.  No cp, sob.     Past Medical History  Diagnosis Date  . History of chicken pox   . Osgood-Schlatter's disease     hx  . Screening for chlamydial disease 09/2008    pos test  . Seasonal allergies   . Allergic rhinitis   . ASCUS (atypical squamous cells of undetermined significance) on Pap smear 01/08/2012    hpv pos   . Vaginal Pap smear, abnormal    OB History  Gravida Para Term Preterm AB SAB TAB Ectopic Multiple Living  0 0 0 0 0 0 0 0 0 0        Past Surgical History  Procedure Laterality Date  . No past surgeries     History   Social History  . Marital Status: Single    Spouse Name: N/A    Number of Children: N/A  . Years of Education: N/A   Occupational History  . Not on file.   Social History Main Topics  . Smoking status: Never Smoker   . Smokeless tobacco: Not on file  . Alcohol Use: No  . Drug Use: No  . Sexual Activity: Yes    Birth Control/ Protection: None   Other Topics Concern  . 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   As off because of work schedule.   Negative TAD   Avoids milk    No current facility-administered  medications on file prior to encounter.   No current outpatient prescriptions on file prior to encounter.   No Known Allergies  ROS: Pertinent items in HPI  OBJECTIVE Blood pressure 114/74, pulse 69, temperature 99 F (37.2 C), temperature source Oral, resp. rate 18, height 5\' 3"  (1.6 m), weight 59.784 kg (131 lb 12.8 oz), last menstrual period 12/19/2013, SpO2 100.00%. GENERAL: Well-developed, well-nourished female in no acute distress.  HEENT: Normocephalic HEART: normal rate RESP: normal effort ABDOMEN: Soft, non-tender BACK: palpable muscle spasm in left lower paraspinals corresponding to her pain.  EXTREMITIES: Nontender, no edema NEURO: Alert and oriented SPECULUM EXAM: NEFG, physiologic discharge, no blood noted, cervix clean   LAB RESULTS Results for orders placed during the hospital encounter of 02/01/14 (from the past 24 hour(s))  URINALYSIS, ROUTINE W REFLEX MICROSCOPIC     Status: Abnormal   Collection Time    02/01/14 10:47 PM      Result Value Ref Range   Color, Urine YELLOW  YELLOW   APPearance CLEAR  CLEAR   Specific Gravity, Urine 1.020  1.005 - 1.030   pH 6.0  5.0 - 8.0   Glucose, UA NEGATIVE  NEGATIVE mg/dL  Hgb urine dipstick LARGE (*) NEGATIVE   Bilirubin Urine NEGATIVE  NEGATIVE   Ketones, ur NEGATIVE  NEGATIVE mg/dL   Protein, ur NEGATIVE  NEGATIVE mg/dL   Urobilinogen, UA 0.2  0.0 - 1.0 mg/dL   Nitrite NEGATIVE  NEGATIVE   Leukocytes, UA NEGATIVE  NEGATIVE  URINE MICROSCOPIC-ADD ON     Status: None   Collection Time    02/01/14 10:47 PM      Result Value Ref Range   Squamous Epithelial / LPF RARE  RARE   WBC, UA 0-2  <3 WBC/hpf   RBC / HPF 0-2  <3 RBC/hpf   Bacteria, UA RARE  RARE  POCT PREGNANCY, URINE     Status: None   Collection Time    02/01/14 10:56 PM      Result Value Ref Range   Preg Test, Ur NEGATIVE  NEGATIVE  HCG, QUANTITATIVE, PREGNANCY     Status: None   Collection Time    02/01/14 11:16 PM      Result Value Ref Range    hCG, Beta Chain, Quant, S <1  <5 mIU/mL    IMAGING No results found.  MAU COURSE  ASSESSMENT 1. Vaginal bleeding   2. Back spasm  PLAN Vaginal bleeding Now seems to be improving.  No evidence on exam UPT neg and HCG neg Not currently pregnant. Reassurance provided This could likely be her cycle coming again  Back spasm - palpable and pain reproducible - recommend heat and stretching.    Discharge home       Follow-up Information   [facility]   Contact information:   [address] [place] [id number] [phone]       Medication List    Notice   You have not been prescribed any medications.       Vale HavenKeli L Beck, MD 02/02/2014  2:00 AM

## 2014-02-01 NOTE — MAU Note (Signed)
Positive HPT last week. LMP 5/10. Left lower back pain, constant, since noon today. Pink spotting since noon today; on toilet paper, wearing panty liner with no blood. Denies abdominal pain. Denies urinary complaints. Vaginal discharge, white & thick. Denies vaginal irritation.

## 2014-02-02 DIAGNOSIS — N898 Other specified noninflammatory disorders of vagina: Secondary | ICD-10-CM

## 2014-02-02 LAB — URINALYSIS, ROUTINE W REFLEX MICROSCOPIC
Bilirubin Urine: NEGATIVE
Glucose, UA: NEGATIVE mg/dL
Ketones, ur: NEGATIVE mg/dL
LEUKOCYTES UA: NEGATIVE
Nitrite: NEGATIVE
PH: 6 (ref 5.0–8.0)
Protein, ur: NEGATIVE mg/dL
Specific Gravity, Urine: 1.02 (ref 1.005–1.030)
Urobilinogen, UA: 0.2 mg/dL (ref 0.0–1.0)

## 2014-02-02 LAB — ABO/RH: ABO/RH(D): A POS

## 2014-02-02 LAB — URINE MICROSCOPIC-ADD ON

## 2014-02-02 LAB — HCG, QUANTITATIVE, PREGNANCY: hCG, Beta Chain, Quant, S: <1 m[IU]/mL (ref <5)

## 2014-02-02 NOTE — Discharge Instructions (Signed)
Bloating Bloating is the feeling of fullness in your belly. You may feel as though your pants are too tight. Often the cause of bloating is overeating, retaining fluids, or having gas in your bowel. It is also caused by swallowing air and eating foods that cause gas. Irritable bowel syndrome is one of the most common causes of bloating. Constipation is also a common cause. Sometimes more serious problems can cause bloating. SYMPTOMS  Usually there is a feeling of fullness, as though your abdomen is bulged out. There may be mild discomfort.  DIAGNOSIS  Usually no particular testing is necessary for most bloating. If the condition persists and seems to become worse, your caregiver may do additional testing.  TREATMENT   There is no direct treatment for bloating.  Do not put gas into the bowel. Avoid chewing gum and sucking on candy. These tend to make you swallow air. Swallowing air can also be a nervous habit. Try to avoid this.  Avoiding high residue diets will help. Eat foods with soluble fibers (examples include root vegetables, apples, or barley) and substitute dairy products with soy and rice products. This helps irritable bowel syndrome.  If constipation is the cause, then a high residue diet with more fiber will help.  Avoid carbonated beverages.  Over-the-counter preparations are available that help reduce gas. Your pharmacist can help you with this. SEEK MEDICAL CARE IF:   Bloating continues and seems to be getting worse.  You notice a weight gain.  You have a weight loss but the bloating is getting worse.  You have changes in your bowel habits or develop nausea or vomiting. SEEK IMMEDIATE MEDICAL CARE IF:   You develop shortness of breath or swelling in your legs.  You have an increase in abdominal pain or develop chest pain. Document Released: 05/29/2006 Document Revised: 10/21/2011 Document Reviewed: 07/17/2007 ExitCare Patient Information 2015 [employer]. This  information is not intended to replace advice given to you by your health care provider. Make sure you discuss any questions you have with your health care provider.  

## 2014-02-03 NOTE — MAU Provider Note (Signed)
Attestation of Attending Supervision of Advanced Practitioner (CNM/NP): Evaluation and management procedures were performed by the Advanced Practitioner under my supervision and collaboration.  I have reviewed the Advanced Practitioner's note and chart, and I agree with the management and plan.  CONSTANT,PEGGY 02/03/2014 9:10 AM   

## 2014-02-04 ENCOUNTER — Ambulatory Visit (INDEPENDENT_AMBULATORY_CARE_PROVIDER_SITE_OTHER): Payer: Self-pay | Admitting: Medical

## 2014-02-04 ENCOUNTER — Encounter: Payer: Self-pay | Admitting: Medical

## 2014-02-04 VITALS — BP 107/75 | HR 62 | Temp 97.5°F | Ht 63.0 in | Wt 131.7 lb

## 2014-02-04 DIAGNOSIS — N926 Irregular menstruation, unspecified: Secondary | ICD-10-CM

## 2014-02-04 DIAGNOSIS — N979 Female infertility, unspecified: Secondary | ICD-10-CM

## 2014-02-04 LAB — HEMOGLOBIN A1C
HEMOGLOBIN A1C: 5.7 % — AB (ref <5.7)
MEAN PLASMA GLUCOSE: 117 mg/dL — AB (ref <117)

## 2014-02-04 NOTE — Patient Instructions (Signed)
Infertility WHAT IS INFERTILITY?  Infertility is usually defined as not being able to get pregnant after trying for one year of regular sexual intercourse without the use of contraceptives. Or not being able to carry a pregnancy to term and have a baby. The infertility rate in the [place] is around 10%. Pregnancy is the result of a chain of events. A woman must release an egg from one of her ovaries (ovulation). The egg must be fertilized by the female sperm. Then it travels through a fallopian tube into the uterus (womb), where it attaches to the wall of the uterus and grows. A Cordney Barstow must have enough sperm, and the sperm must join with (fertilize) the egg along the way, at the proper time. The fertilized egg must then become attached to the inside of the uterus. While this may seem simple, many things can happen to prevent pregnancy from occurring.  WHOSE PROBLEM IS IT?  About 20% of infertility cases are due to problems with the Loukisha Gunnerson (female factors) and 65% are due to problems with the woman (female factors). Other cases are due to a combination of female and female factors or to unknown causes.  WHAT CAUSES INFERTILITY IN MEN?  Infertility in men is often caused by problems with making enough normal sperm or getting the sperm to reach the egg. Problems with sperm may exist from birth or develop later in life, due to illness or injury. Some men produce no sperm, or produce too few sperm (oligospermia). Other problems include:  Sexual dysfunction.  Hormonal or endocrine problems.  Age. Female fertility decreases with age, but not at as young an age as female fertility.  Infection.  Congenital problems. Birth defect, such as absence of the tubes that carry the sperm (vas deferens).  Genetic/chromosomal problems.  Antisperm antibody problems.  Retrograde ejaculation (sperm go into the bladder).  Varicoceles, spematoceles, or tumors of the testicles.  Lifestyle can influence the number and  quality of a Chuck Caban's sperm.  Alcohol and drugs can temporarily reduce sperm quality.  Environmental toxins, including pesticides and lead, may cause some cases of infertility in men. WHAT CAUSES INFERTILITY IN WOMEN?   Problems with ovulation account for most infertility in women. Without ovulation, eggs are not available to be fertilized.  Signs of problems with ovulation include irregular menstrual periods or no periods at all.  Simple lifestyle factors, including stress, diet, or athletic training, can affect a woman's hormonal balance.  Age. Fertility begins to decrease in women in the early 76s and is worse after age 71.  Much less often, a hormonal imbalance from a serious medical problem, such as a pituitary gland tumor, thyroid or other chronic medical disease, can cause ovulation problems.  Pelvic infections.  Polycystic ovary syndrome (increase in female hormones, unable to ovulate).  Alcohol or illegal drugs.  Environmental toxins, radiation, pesticides, and certain chemicals.  Aging is an important factor in female infertility.  The ability of a woman's ovaries to produce eggs declines with age, especially after age 27. About one third of couples where the woman is over 21 will have problems with fertility.  By the time she reaches menopause when her monthly periods stop for good, a woman can no longer produce eggs or become pregnant.  Other problems can also lead to infertility in women. If the fallopian tubes are blocked at one or both ends, the egg cannot travel through the tubes into the uterus. Scar tissue (adhesions) in the pelvis may cause blocked  tubes. This may result from pelvic inflammatory disease, endometriosis, or surgery for an ectopic pregnancy (fertilized egg implanted outside the uterus) or any pelvic or abdominal surgery causing adhesions.  Fibroid tumors or polyps of the uterus.  Congenital (birth defect) abnormalities of the uterus.  Infection of the  cervix (cervicitis).  Cervical stenosis (narrowing).  Abnormal cervical mucus.  Polycystic ovary syndrome.  Having sexual intercourse too often (every other day or 4 to 5 times a week).  Obesity.  Anorexia.  Poor nutrition.  Over exercising, with loss of body fat.  DES. Your mother received diethylstilbesterol hormone when pregnant with you. HOW IS INFERTILITY TESTED?  If you have been trying to have a baby without success, you may want to seek medical help. You should not wait for one year of trying before seeing a health care provider if:  You are over 35.  You have reason to believe that there may be a fertility problem. A medical evaluation may determine the reasons for a couple's infertility. Usually this process begins with:  Physical exams.  Medical histories of both partners.  Sexual histories of both partners. If there is no obvious problem, like improperly timed intercourse or absence of ovulation, tests may be needed.   For a Rebacca Votaw, testing usually begins with tests of his semen to look at:  The number of sperm.  The shape of sperm.  Movement of his sperm.  Taking a complete medical and surgical history.  Physical examination.  Check for infection of the female reproductive organs. Sometimes hormone tests are done.   For a woman, the first step in testing is to find out if she is ovulating each month. There are several ways to do this. For example, she can keep track of changes in her morning body temperature and in the texture of her cervical mucus. Another tool is a home ovulation test kit, which can be bought at drug or grocery stores.  Checks of ovulation can also be done in the doctor's office, using blood tests for hormone levels or ultrasound tests of the ovaries. If the woman is ovulating, more tests will need to be done. Some common female tests include:  Hysterosalpingogram: An x-ray of the fallopian tubes and uterus after they are injected with  dye. It shows if the tubes are open and shows the shape of the uterus.  Laparoscopy: An exam of the tubes and other female organs for disease. A lighted tube called a laparoscope is used to see inside the abdomen.  Endometrial biopsy: Sample of uterus tissue taken on the first day of the menstrual period, to see if the tissue indicates you are ovulating.  Transvaginal ultrasound: Examines the female organs.  Hysteroscopy: Uses a lighted tube to examine the cervix and inside the uterus, to see if there are any abnormalities inside the uterus. TREATMENT  Depending on the test results, different treatments can be suggested. The type of treatment depends on the cause. 85 to 90% of infertility cases are treated with drugs or surgery.   Various fertility drugs may be used for women with ovulation problems. It is important to talk with your caregiver about the drug to be used. You should understand the drug's benefits and side effects. Depending on the type of fertility drug and the dosage of the drug used, multiple births (twins or multiples) can occur in some women.  If needed, surgery can be done to repair damage to a woman's ovaries, fallopian tubes, cervix, or uterus.  Surgery  or medical treatment for endometriosis or polycystic ovary syndrome. Sometimes a Lillianna Sabel has an infertility problem that can be corrected with medicine or by surgery.  Intrauterine insemination (IUI) of sperm, timed with ovulation.  Change in lifestyle, if that is the cause (lose weight, increase exercise, and stop smoking, drinking excessively, or taking illegal drugs).  Other types of surgery:  Removing growths inside and on the uterus.  Removing scar tissue from inside of the uterus.  Fixing blocked tubes.  Removing scar tissue in the pelvis and around the female organs. WHAT IS ASSISTED REPRODUCTIVE TECHNOLOGY (ART)?  Assisted reproductive technology (ART) is another form of special methods used to help infertile  couples. ART involves handling both the woman's eggs and the Johniece Hornbaker's sperm. Success rates vary and depend on many factors. ART can be expensive and time-consuming. But ART has made it possible for many couples to have children that otherwise would not have been conceived. Some methods are listed below:  In vitro fertilization (IVF). IVF is often used when a woman's fallopian tubes are blocked or when a Dexton Zwilling has low sperm counts. A drug is used to stimulate the ovaries to produce multiple eggs. Once mature, the eggs are removed and placed in a culture dish with the Beza Steppe's sperm for fertilization. After about 40 hours, the eggs are examined to see if they have become fertilized by the sperm and are dividing into cells. These fertilized eggs (embryos) are then placed in the woman's uterus. This bypasses the fallopian tubes.  Gamete intrafallopian transfer (GIFT) is similar to IVF, but used when the woman has at least one normal fallopian tube. Three to five eggs are placed in the fallopian tube, along with the Riah Kehoe's sperm, for fertilization inside the woman's body.  Zygote intrafallopian transfer (ZIFT), also called tubal embryo transfer, combines IVF and GIFT. The eggs retrieved from the woman's ovaries are fertilized in the lab and placed in the fallopian tubes rather than in the uterus.  ART procedures sometimes involve the use of donor eggs (eggs from another woman) or previously frozen embryos. Donor eggs may be used if a woman has impaired ovaries or has a genetic disease that could be passed on to her baby.  When performing ART, you are at higher risk for resulting in multiple pregnancies, twins, triplets or more.  Intracytoplasma sperm injection is a procedure that injects a single sperm into the egg to fertilize it.  Embryo transplant is a procedure that starts after growing an embryo in a special media (chemical solution) developed to keep the embryo alive for 2 to 5 days, and then transplanting it  into the uterus. In cases where a cause cannot be found and pregnancy does not occur, adoption may be a consideration. Document Released: 08/01/2003 Document Revised: 10/21/2011 Document Reviewed: 06/27/2009 [facility] Patient Information 2015 [place], [place]. This information is not intended to replace advice given to you by your health care provider. Make sure you discuss any questions you have with your health care provider.

## 2014-02-04 NOTE — Progress Notes (Signed)
Patient ID: Christine SachsArdreanna N Rose, female   DOB: 05/07/1991, 23 y.o.   MRN: [id number]  History:  Ms. Christine Sachsrdreanna N Rose  is a 23 y.o. G0P0000 who presents to clinic today for irregular periods. The patient had issues with heavy bleeding starting in 2008. She was started on Depo Provera in 2010 until 2012. She states no periods for a few months after stopping the Depo Provera and then irregular periods since then. LMP 12/19/13. Patient states that she bled x 4 days. Prior to that period she had 3 days of bleeding 12/07/13. Then prior to that she had 3 days of bleeding 11/26/13. She denies dysmenorrhea. She denies chronic medical problems. She is sexually active. She has not used birth control since stopping Depo Provera. Uses condoms sometimes. Patient desires pregnancy. She states that they have been trying to conceive since 2012 or 2013 without success. Patient was seen in [facility] on 02/01/14 and had quant hCG of <1.   The following portions of the patient's history were reviewed and updated as appropriate: allergies, current medications, past family history, past medical history, past social history, past surgical history and problem list.  Review of Systems:  Pertinent items are noted in HPI.  Objective:  Physical Exam BP 107/75  Pulse 62  Temp(Src) 97.5 F (36.4 C) (Oral)  Ht 5\' 3"  (1.6 m)  Wt 131 lb 11.2 oz (59.739 kg)  BMI 23.34 kg/m2  LMP 12/19/2013 GENERAL: Well-developed, well-nourished female in no acute distress.  HEENT: Normocephalic, atraumatic.  NECK: Supple. Normal thyroid.  LUNGS: Normal rate. Clear to auscultation bilaterally.  HEART: Regular rate and rhythm with no adventitious sounds.  ABDOMEN: Soft, nontender, nondistended. No organomegaly. Normal bowel sounds appreciated in all quadrants.  PELVIC: Deferred EXTREMITIES: No cyanosis, clubbing, or edema   Assessment & Plan:  Assessment: Irregular periods Infertility  Plans: Hgb A1c, TSH, FSH, and Prolactin today Patient  referred to Dr. April MansonYalcinkaya for infertility management Patient will be contacted with any abnormal results Patient may follow-up with [facility] as needed  Freddi StarrJulie N Ethier, PA-C 02/04/2014 9:14 AM

## 2014-02-05 LAB — TSH: TSH: 3.672 u[IU]/mL (ref 0.350–4.500)

## 2014-02-05 LAB — PROLACTIN: Prolactin: 6.9 ng/mL

## 2014-02-05 LAB — FOLLICLE STIMULATING HORMONE: FSH: 6.4 m[IU]/mL

## 2014-02-08 ENCOUNTER — Telehealth: Payer: Self-pay | Admitting: General Practice

## 2014-02-08 NOTE — Telephone Encounter (Signed)
Called patient and informed her of results and recommendations. Patient verbalized understanding and had no other questions.

## 2014-02-08 NOTE — Telephone Encounter (Signed)
Message copied by Kathee DeltonHILLMAN, CARRIE L on Tue Feb 08, 2014 11:49 AM ------      Message from: Freddi StarrETHIER, JULIE N      Created: Mon Feb 07, 2014  8:10 AM       Please call patient and let her know that all of the lab work was normal, so it is very important that she follow-up with Dr. April MansonYalcinkaya as planned. [facility] to discuss birth control options that would help regulate her periods.             Thanks!            Raynelle FanningJulie ------

## 2014-05-17 ENCOUNTER — Telehealth: Payer: Self-pay | Admitting: Internal Medicine

## 2014-05-17 NOTE — Telephone Encounter (Signed)
Patient Information:  Caller Name: Jerilynn  Phone: [phone]  Patient: Christine Rose, Christine Rose  Gender: Female  DOB: 11/12/1990  Age: 23 Years  PCP: Berniece AndreasPanosh, [facility](Family Practice)  Office Follow Up:  Does the office need to follow up with this patient?: No  Instructions For The Office: N/A   Symptoms  Reason For Call & Symptoms: Has missed 2 days ogf work due to headaches - needs Dr Note to go back to work..  Fever intermittently.  Headaches with gagging x10 days, vomiting once yesterday 10/5.  Some stomach cramping, feeling bones aching, sometimes dizzy to stand.  Reviewed Health History In EMR: Yes  Reviewed Medications In EMR: Yes  Reviewed Allergies In EMR: Yes  Reviewed Surgeries / Procedures: Yes  Date of Onset of Symptoms: 05/07/2014  Guideline(s) Used:  Headache  Disposition Per Guideline:   See Today in Office  Reason For Disposition Reached:   Patient wants to be seen  Advice Given:  Rest:   Lie down in a dark, quiet place and try to relax. Close your eyes and imagine your entire body relaxing.  Apply Cold to the Area:   Apply a cold wet washcloth or cold pack to the forehead for 20 minutes.  Call Back If:  You become worse.  Patient Will Follow Care Advice:  YES  Appointment Scheduled:  05/18/2014 14:45:00 Appointment Scheduled Provider:  Berniece AndreasPanosh, Wanda [facility](Family Practice)

## 2014-05-18 ENCOUNTER — Ambulatory Visit (INDEPENDENT_AMBULATORY_CARE_PROVIDER_SITE_OTHER): Payer: Self-pay | Admitting: Internal Medicine

## 2014-05-18 ENCOUNTER — Encounter: Payer: Self-pay | Admitting: Internal Medicine

## 2014-05-18 VITALS — BP 110/80 | HR 100 | Temp 98.8°F | Wt 128.0 lb

## 2014-05-18 DIAGNOSIS — R509 Fever, unspecified: Secondary | ICD-10-CM

## 2014-05-18 DIAGNOSIS — R1011 Right upper quadrant pain: Secondary | ICD-10-CM

## 2014-05-18 LAB — POCT URINALYSIS DIPSTICK
BILIRUBIN UA: NEGATIVE
Blood, UA: NEGATIVE
GLUCOSE UA: NEGATIVE
Ketones, UA: NEGATIVE
Leukocytes, UA: NEGATIVE
NITRITE UA: NEGATIVE
Protein, UA: NEGATIVE
Spec Grav, UA: 1.015
UROBILINOGEN UA: 1
pH, UA: 7

## 2014-05-18 LAB — POCT URINE PREGNANCY: PREG TEST UR: NEGATIVE

## 2014-05-18 NOTE — Progress Notes (Signed)
Chief Complaint  Patient presents with  . gi upset  . Headache    HPI: Patient Christine Rose  comes in today for SDA for  new problem evaluation. .   lmp: sept 25 or so . Onset fever and ,middle ruq area abd pain  For 10 days  100.2 + range  Some nausea vomiting headache no rash cough uti sx  Travel diarrhea  Other exposures.  Denies  sti risk or sx .  No hx of hematuria uti sx  No rx  ROS: See pertinent positives and negatives per HPI. Missed work 2 days  No uti sx some dec appetite no diarrhea no meds area of pain mid to ruq area .   Past Medical History  Diagnosis Date  . History of chicken pox   . Osgood-Schlatter's disease     hx  . Screening for chlamydial disease 09/2008    pos test  . Seasonal allergies   . Allergic rhinitis   . ASCUS (atypical squamous cells of undetermined significance) on Pap smear 01/08/2012    hpv pos   . Vaginal Pap smear, abnormal     Family History  Problem Relation Age of Onset  . Other Mother     large ASD declining surgical intervention    History   Social History  . Marital Status: Single    Spouse Name: N/A    Number of Children: N/A  . Years of Education: N/A   Social History Main Topics  . Smoking status: Never Smoker   . Smokeless tobacco: None  . Alcohol Use: No  . Drug Use: No  . Sexual Activity: Yes    Birth Control/ Protection: None   Other Topics Concern  . 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   As off because of work schedule.   Negative TAD   Avoids milk     No outpatient encounter prescriptions on file as of 05/18/2014.    EXAM:  BP 110/80  Pulse 100  Temp(Src) 98.8 F (37.1 C) (Oral)  Wt 128 lb (58.06 kg)  SpO2 98%  LMP 05/04/2014  Body mass index is 22.68 kg/(m^2).  GENERAL: vitals reviewed  and listed above, alert, oriented, appears well hydrated and in no acute distress non toxic mildly ill  HEENT: atraumatic, conjunctiva  clear, no obvious abnormalities on inspection of external nose and ears tms clear OP : no lesion edema or exudate  NECK: no obvious masses on inspection palpation no adenopathy LUNGS: clear to auscultation bilaterally, no wheezes, rales or rhonchi, good air movement CV: HRRR, no clubbing cyanosis or  peripheral edema nl cap refill  Abdomen:  normal bowel sounds without hepatosplenomegaly, no guarding rebound or masses no CVA tenderness tender mid epigastric and ruq not severe on palpation.    MS: moves all extremities without noticeable focal  Abnormality Skin: non icteric no actual rash bruise turgor. PSYCH: pleasant and cooperative, no obvious depression or anxiety UA clear except  uro+  ucg pending  ASSESSMENT AND PLAN:  Discussed the following assessment and plan:  Right upper quadrant pain - Plan: POCT urinalysis dipstick, POCT urine pregnancy, CANCELED: Basic metabolic panel, CANCELED: CBC with Differential, CANCELED: Hepatic function panel, CANCELED: Epstein-Barr virus VCA  antibody panel, CANCELED: Sedimentation rate  Other specified fever - Plan: POCT urinalysis dipstick, POCT urine pregnancy, CANCELED: Basic metabolic panel, CANCELED: CBC with Differential, CANCELED: Hepatic function panel, CANCELED: Epstein-Barr virus VCA antibody panel, CANCELED: Sedimentation rate Labs ordered   rx give as she requested this to do at [facility]   cbcdiff bmp lfts  Es[facility].  Note for work -Patient advised to return or notify health care team  if symptoms worsen ,persist or new concerns arise.  Patient Instructions  Uncertain cause of your fever today.  But check labs blood count and liver tests. No evidence of uti . At this time  Will contact you about results . And if worse.  Call oncall service and  Ed if needed .  rov in 2 days. Reexamine  If not better    Standley Brooking. Barnet Benavides M.D.

## 2014-05-18 NOTE — Progress Notes (Signed)
Pre visit review using our clinic review tool, if applicable. No additional management support is needed unless otherwise documented below in the visit note. 

## 2014-05-18 NOTE — Patient Instructions (Addendum)
Uncertain cause of your fever today.  But check labs blood count and liver tests. No evidence of uti . At this time  Will contact you about results . And if worse.  Call oncall service and  Ed if needed .  rov in 2 days. Reexamine  If not better

## 2014-05-20 ENCOUNTER — Encounter: Payer: Self-pay | Admitting: Internal Medicine

## 2014-05-20 ENCOUNTER — Other Ambulatory Visit (HOSPITAL_COMMUNITY)
Admission: RE | Admit: 2014-05-20 | Discharge: 2014-05-20 | Disposition: A | Discharge status: Home / Self Care | Payer: Self-pay | Source: Ambulatory Visit | Attending: Internal Medicine | Admitting: Internal Medicine

## 2014-05-20 ENCOUNTER — Inpatient Hospital Stay: Admission: RE | Admit: 2014-05-20 | Payer: Self-pay | Source: Ambulatory Visit

## 2014-05-20 ENCOUNTER — Ambulatory Visit (INDEPENDENT_AMBULATORY_CARE_PROVIDER_SITE_OTHER): Payer: Self-pay | Admitting: Internal Medicine

## 2014-05-20 VITALS — BP 106/76 | Temp 98.2°F | Wt 128.9 lb

## 2014-05-20 DIAGNOSIS — Z113 Encounter for screening for infections with a predominantly sexual mode of transmission: Secondary | ICD-10-CM | POA: Insufficient documentation

## 2014-05-20 DIAGNOSIS — Z01411 Encounter for gynecological examination (general) (routine) with abnormal findings: Secondary | ICD-10-CM | POA: Insufficient documentation

## 2014-05-20 DIAGNOSIS — R509 Fever, unspecified: Secondary | ICD-10-CM | POA: Insufficient documentation

## 2014-05-20 DIAGNOSIS — Z01419 Encounter for gynecological examination (general) (routine) without abnormal findings: Secondary | ICD-10-CM

## 2014-05-20 DIAGNOSIS — R1011 Right upper quadrant pain: Secondary | ICD-10-CM | POA: Insufficient documentation

## 2014-05-20 DIAGNOSIS — R1031 Right lower quadrant pain: Secondary | ICD-10-CM

## 2014-05-20 LAB — CBC WITH DIFFERENTIAL/PLATELET
Basophils Absolute: 0 10*3/uL (ref 0.0–0.1)
Basophils Relative: 0.7 % (ref 0.0–3.0)
Eosinophils Absolute: 0.1 10*3/uL (ref 0.0–0.7)
Eosinophils Relative: 1.3 % (ref 0.0–5.0)
HCT: 39.9 % (ref 36.0–46.0)
Hemoglobin: 12.8 g/dL (ref 12.0–15.0)
LYMPHS ABS: 1.5 10*3/uL (ref 0.7–4.0)
Lymphocytes Relative: 33.3 % (ref 12.0–46.0)
MCHC: 32.1 g/dL (ref 30.0–36.0)
MCV: 84.9 fl (ref 78.0–100.0)
Monocytes Absolute: 0.4 10*3/uL (ref 0.1–1.0)
Monocytes Relative: 8.5 % (ref 3.0–12.0)
NEUTROS ABS: 2.5 10*3/uL (ref 1.4–7.7)
Neutrophils Relative %: 56.2 % (ref 43.0–77.0)
PLATELETS: 268 10*3/uL (ref 150.0–400.0)
RBC: 4.7 Mil/uL (ref 3.87–5.11)
RDW: 13.1 % (ref 11.5–15.5)
WBC: 4.4 10*3/uL (ref 4.0–10.5)

## 2014-05-20 LAB — BASIC METABOLIC PANEL
BUN: 11 mg/dL (ref 6–23)
CHLORIDE: 108 meq/L (ref 96–112)
CO2: 22 meq/L (ref 19–32)
CREATININE: 0.7 mg/dL (ref 0.4–1.2)
Calcium: 9.1 mg/dL (ref 8.4–10.5)
GFR: 134.32 mL/min (ref >60.00)
Glucose, Bld: 80 mg/dL (ref 70–99)
Potassium: 4.3 mEq/L (ref 3.5–5.1)
Sodium: 137 mEq/L (ref 135–145)

## 2014-05-20 LAB — HEPATIC FUNCTION PANEL
ALBUMIN: 3.4 g/dL — AB (ref 3.5–5.2)
ALT: 15 U/L (ref 0–35)
AST: 18 U/L (ref 0–37)
Alkaline Phosphatase: 55 U/L (ref 39–117)
Bilirubin, Direct: 0.1 mg/dL (ref 0.0–0.3)
Total Bilirubin: 0.7 mg/dL (ref 0.2–1.2)
Total Protein: 7.7 g/dL (ref 6.0–8.3)

## 2014-05-20 LAB — SEDIMENTATION RATE: Sed Rate: 8 mm/hr (ref 0–22)

## 2014-05-20 MED ORDER — DOXYCYCLINE HYCLATE 100 MG PO TABS: 100.0000 mg | ORAL_TABLET | Freq: Two times a day (BID) | ORAL | Status: DC

## 2014-05-20 MED ORDER — ONDANSETRON 4 MG PO TBDP: 4.0000 mg | ORAL_TABLET | Freq: Three times a day (TID) | ORAL | Status: DC | PRN

## 2014-05-20 MED ORDER — CEFTRIAXONE SODIUM 1 G IJ SOLR
1.0000 g | Freq: Once | INTRAMUSCULAR | Status: AC
  Administered 2014-05-20: 1 g via INTRAMUSCULAR

## 2014-05-20 NOTE — Addendum Note (Signed)
Addended by: Raj JanusADKINS, Urho Rio T on: 05/20/2014 04:56 PM   Modules accepted: Orders

## 2014-05-20 NOTE — Progress Notes (Signed)
Pre visit review using our clinic review tool, if applicable. No additional management support is needed unless otherwise documented below in the visit note.   Chief Complaint  Patient presents with  . Headache    Started on Wednesday.  Continues to feel bad.  Last vomited last night.  . Stomach Pain  . Emesis    HPI:  Christine Rose comes in for fu of ruq pain and fever vominting    Unfortunately she did not get any of her lab work done she wanted to get it at [facility] didn't have a ride to didn't get it done at all comes in today last vmit last pm fluuids ok  Had tenmp 99 range last pm . No uti sx no bowlel changes painabout the same but poss more in RLQ area.  ROS: See pertinent positives and negatives per HPI. No cough no UTI symptoms no vaginal discharge no diarrhea. 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 History of chicken pox   . Osgood-Schlatter's disease     hx  . Screening for chlamydial disease 09/2008    pos test  . Seasonal allergies   . Allergic rhinitis   . ASCUS (atypical squamous cells of undetermined significance) on Pap smear 01/08/2012    hpv pos   . Vaginal Pap smear, abnormal     Family History  Problem Relation Age of Onset  . Other Mother     large ASD declining surgical intervention    History   Social History  . Marital Status: Single    Spouse Name: N/A    Number of Children: N/A  . Years of Education: N/A   Social History Main Topics  . Smoking status: Never Smoker   . Smokeless tobacco: None  . Alcohol Use: No  . Drug Use: No  . Sexual Activity: Yes    Birth Control/ Protection: None   Other Topics Concern  . 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   As off because of work schedule.   Negative TAD   Avoids milk     Outpatient Encounter Prescriptions as of 05/20/2014  Medication Sig  . doxycycline (VIBRA-TABS) 100 MG tablet Take 1 tablet (100 mg total) by mouth 2 (two) times daily.  . ondansetron (ZOFRAN-ODT) 4 MG disintegrating tablet Take 1 tablet (4 mg total) by mouth every 8 (eight) hours as needed for nausea or vomiting.    EXAM:  BP 106/76  Temp(Src) 98.2 F (36.8 C) (Oral)  Wt 128 lb 14.4 oz (58.469 kg)  LMP 05/04/2014  Body mass index is 22.84 kg/(m^2).  GENERAL: vitals reviewed and listed above, alert, oriented, appears well hydrated and in no acute distress appears somewhat flat but not acutely pain nontoxic but sick HEENT: atraumatic, conjunctiva  clear, no obvious abnormalities on inspection of external nose and ears OP : no lesion edema or exudate  NECK: no obvious masses on inspection palpation  LUNGS: clear to auscultation bilaterally, no wheezes, rales or rhonchi, good air movement CV: HRRR, no clubbing cyanosis or  peripheral edema nl cap refill  MS: moves all extremities without noticeable focal  Abnormality Abdomen:  Sof,t normal bowel sounds without  hepatosplenomegaly, no guarding rebound or masses no CVA tenderness ru and rlq tenderness without guarding or rebound.  Pelvic: NL ext GU, labia clear without lesions or rash . Vagina no lesions .Cervix: clear small mucoid whith dc   UTERUS: Neg Adnexa:  clear no masses . PAP done [facility]  Pt reports  tender on adenexa bilaterally and ? cmt but no  Wincing facial reaction  So uncertain how tender .   ASSESSMENT AND PLAN:  Discussed the following assessment and plan:  Right upper quadrant pain - Plan: Basic metabolic panel, CBC with Differential, Hepatic function panel, Sedimentation rate, CT Abdomen Pelvis W Contrast  Acute right lower quadrant pain - poss pid   exam not impressive but dose have ruq pain  that can be seen with chlarmydia but has had neg screens recently - Plan: Basic metabolic  panel, CBC with Differential, Hepatic function panel, Sedimentation rate, CT Abdomen Pelvis W Contrast, PAP [[place]]  Other specified fever - Plan: Basic metabolic panel, CBC with Differential, Hepatic function panel, Sedimentation rate, CT Abdomen Pelvis W Contrast, PAP [[place]]  Encounter for routine gynecological examination - Plan: PAP [[place]] Unfortunately did not get any of her lab work blood work done from 2 days ago because she didn't have a ride : continues to have symptoms worsening. Her pelvic exam is mildly tender could be a cause but uncertain. [facility] tests will be done consider abdominal CT rule out chronic appendicitis. Her no obvious Peritoneal signs on her exam today. We'll treat for empiric PID and evaluate for above because it is Friday. -pt declined ct today and to do on Monday  Rocephin today and add antibiotic  In interim . Marland Kitchen.  Patient Instructions  We are going to treat you as if you have a pelvic infection but uncertain cause at this time . Lab test and may get imaging study to make sure you dont have a low grade appendicitis .   If things are getting worse with fever pain and vomiting he may need to seek emergency care in the emergency department over the weekend Giving an injection of an antibiotic prescription for nausea medicine and we'll send in a pill antibiotic depending on the results of your lab work and scan. Plan followup next week or depending on the scan   Neta MendsWanda K. Panosh M.D.

## 2014-05-20 NOTE — Patient Instructions (Addendum)
We are going to treat you as if you have a pelvic infection but uncertain cause at this time . Lab test and may get imaging study to make sure you dont have a low grade appendicitis .   If things are getting worse with fever pain and vomiting he may need to seek emergency care in the emergency department over the weekend Giving an injection of an antibiotic prescription for nausea medicine and we'll send in a pill antibiotic depending on the results of your lab work and scan. Plan followup next week or depending on the scan

## 2014-05-23 ENCOUNTER — Ambulatory Visit: Admission: RE | Admit: 2014-05-23 | Payer: Self-pay | Source: Ambulatory Visit

## 2014-05-24 LAB — CYTOLOGY - PAP

## 2014-06-01 ENCOUNTER — Encounter: Payer: Self-pay | Admitting: Family Medicine

## 2014-10-23 ENCOUNTER — Emergency Department (HOSPITAL_COMMUNITY)
Admission: EM | Admit: 2014-10-23 | Discharge: 2014-10-23 | Disposition: A | Discharge status: Home / Self Care | Payer: Self-pay | Attending: Emergency Medicine | Admitting: Emergency Medicine

## 2014-10-23 ENCOUNTER — Encounter (HOSPITAL_COMMUNITY): Payer: Self-pay | Admitting: Nurse Practitioner

## 2014-10-23 DIAGNOSIS — M791 Myalgia: Secondary | ICD-10-CM | POA: Insufficient documentation

## 2014-10-23 DIAGNOSIS — Z792 Long term (current) use of antibiotics: Secondary | ICD-10-CM | POA: Insufficient documentation

## 2014-10-23 DIAGNOSIS — Z8619 Personal history of other infectious and parasitic diseases: Secondary | ICD-10-CM | POA: Insufficient documentation

## 2014-10-23 DIAGNOSIS — M7918 Myalgia, other site: Secondary | ICD-10-CM

## 2014-10-23 NOTE — Discharge Instructions (Signed)
Rest, Ice intermittently (in the first 24-48 hours), Gentle compression with an Ace wrap, and elevate (Limb above the level of the heart)   Take up to 800mg  of ibuprofen (that is usually 4 over the counter pills)  3 times a day for 5 days. Take with food.  Please follow with your primary care doctor in the next 2 days for a check-up. They must obtain records for further management.   Do not hesitate to return to the Emergency Department for any new, worsening or concerning symptoms.    Musculoskeletal Pain Musculoskeletal pain is muscle and boney aches and pains. These pains can occur in any part of the body. Your caregiver may treat you without knowing the cause of the pain. They may treat you if blood or urine tests, X-rays, and other tests were normal.  CAUSES There is often not a definite cause or reason for these pains. These pains may be caused by a type of germ (virus). The discomfort may also come from overuse. Overuse includes working out too hard when your body is not fit. Boney aches also come from weather changes. Bone is sensitive to atmospheric pressure changes. HOME CARE INSTRUCTIONS   Ask when your test results will be ready. Make sure you get your test results.  Only take over-the-counter or prescription medicines for pain, discomfort, or fever as directed by your caregiver. If you were given medications for your condition, do not drive, operate machinery or power tools, or sign legal documents for 24 hours. Do not drink alcohol. Do not take sleeping pills or other medications that may interfere with treatment.  Continue all activities unless the activities cause more pain. When the pain lessens, slowly resume normal activities. Gradually increase the intensity and duration of the activities or exercise.  During periods of severe pain, bed rest may be helpful. Lay or sit in any position that is comfortable.  Putting ice on the injured area.  Put ice in a bag.  Place a  towel between your skin and the bag.  Leave the ice on for 15 to 20 minutes, 3 to 4 times a day.  Follow up with your caregiver for continued problems and no reason can be found for the pain. If the pain becomes worse or does not go away, it may be necessary to repeat tests or do additional testing. Your caregiver may need to look further for a possible cause. SEEK IMMEDIATE MEDICAL CARE IF:  You have pain that is getting worse and is not relieved by medications.  You develop chest pain that is associated with shortness or breath, sweating, feeling sick to your stomach (nauseous), or throw up (vomit).  Your pain becomes localized to the abdomen.  You develop any new symptoms that seem different or that concern you. MAKE SURE YOU:   Understand these instructions.  Will watch your condition.  Will get help right away if you are not doing well or get worse. Document Released: 07/29/2005 Document Revised: 10/21/2011 Document Reviewed: 04/02/2013 [facility] Patient Information 2015 [place], [place]. This information is not intended to replace advice given to you by your health care provider. Make sure you discuss any questions you have with your health care provider.

## 2014-10-23 NOTE — ED Provider Notes (Signed)
CSN: [id number]     Arrival date & time 10/23/14  1240 History  This chart was scribed for a non-physician practitioner, Wynetta EmeryNicole Camesha Farooq, PA-C working with Benjiman CoreNathan Pickering, MD by [place] Peace, ED Scribe. The patient was seen in TR07C/TR07C. The patient's care was started at 12:57 PM.     Chief Complaint  Patient presents with  . Leg Pain      Patient is a 24 y.o. female presenting with leg pain. The history is provided by the patient. No language interpreter was used.  Leg Pain   HPI Comments: Christine Rose is a 24 y.o. female who presents to the Emergency Department complaining of right leg pain onset x 2 weeks. Note that this contradicts nursing note of bilateral leg pain; I have verified with the patient several times that this is a focal right lower extremity pain. Pt notes a "knot" to the distal aspect of her shin. Rates pain as 10/10; exacerbated with ambulation and bearing weight. She states she has not taken any medication to address pain. Pt denies any recent injuries or traumas to affected area. Pt is non smoker.    Past Medical History  Diagnosis Date  . History of chicken pox   . Osgood-Schlatter's disease     hx  . Screening for chlamydial disease 09/2008    pos test  . Seasonal allergies   . Allergic rhinitis   . ASCUS (atypical squamous cells of undetermined significance) on Pap smear 01/08/2012    hpv pos   . Vaginal Pap smear, abnormal    Past Surgical History  Procedure Laterality Date  . No past surgeries     Family History  Problem Relation Age of Onset  . Other Mother     large ASD declining surgical intervention   History  Substance Use Topics  . Smoking status: Never Smoker   . Smokeless tobacco: Not on file  . Alcohol Use: No   OB History    Gravida Para Term Preterm AB TAB SAB Ectopic Multiple Living   0 0 0 0 0 0 0 0 0 0      Review of Systems  Musculoskeletal:       Right leg pain.   Skin:       "Knot" to distal aspect of shin.   A complete 10 system review of systems was obtained and all systems are negative except as noted in the HPI and PMH.      Allergies  Review of patient's allergies indicates no known allergies.  Home Medications   Prior to Admission medications   Medication Sig Start Date End Date Taking? Authorizing Provider  doxycycline (VIBRA-TABS) 100 MG tablet Take 1 tablet (100 mg total) by mouth 2 (two) times daily. 05/20/14   Madelin HeadingsWanda K Panosh, MD  ondansetron (ZOFRAN-ODT) 4 MG disintegrating tablet Take 1 tablet (4 mg total) by mouth every 8 (eight) hours as needed for nausea or vomiting. 05/20/14   Madelin HeadingsWanda K Panosh, MD   BP 115/75 mmHg  Pulse 59  Temp(Src) 97.8 F (36.6 C) (Oral)  Resp 16  Ht 5\' 3"  (1.6 m)  Wt 132 lb 1 oz (59.903 kg)  BMI 23.40 kg/m2  SpO2 100% Physical Exam  Constitutional: She is oriented to person, place, and time. She appears well-developed and well-nourished. No distress.  HENT:  Head: Normocephalic and atraumatic.  Eyes: Conjunctivae and EOM are normal.  Neck: Neck supple. No tracheal deviation present.  Cardiovascular: Normal rate.   Pulmonary/Chest: Effort normal. No  respiratory distress.  Abdominal: Soft.  Musculoskeletal: Normal range of motion.       Legs: Slight soft tissue swelling with tenderness palpation, no overlying skin changes.  No calf asymmetry, superficial collaterals, palpable cords, edema, Homans sign negative bilaterally.    Neurological: She is alert and oriented to person, place, and time.  Skin: Skin is warm and dry.  Psychiatric: She has a normal mood and affect. Her behavior is normal.  Nursing note and vitals reviewed.   ED Course  Procedures (including critical care time) Labs Review Labs Reviewed - No data to display  Imaging Review No results found.   EKG Interpretation None     Medications - No data to display  12:59 PM- Treatment plan was discussed with patient who verbalizes understanding and agrees.   MDM    Final diagnoses:  Musculoskeletal pain    Filed Vitals:   10/23/14 1251  BP: 115/75  Pulse: 59  Temp: 97.8 F (36.6 C)  TempSrc: Oral  Resp: 16  Height:  (1.6 m)  Weight: 132 lb 1 oz (59.903 kg)  SpO2: 100%    Christine Rose is a pleasant 24 y.o. female presenting with soft tissue swelling to anterior right leg. No overlying skin changes. This may be a contusion that she didn't notice on the original trauma. Encouraged, ice, compression and NSAIDs. Patient encouraged to follow closely with primary care physician.  Evaluation does not show pathology that would require ongoing emergent intervention or inpatient treatment. Pt is hemodynamically stable and mentating appropriately. Discussed findings and plan with patient/guardian, who agrees with care plan. All questions answered. Return precautions discussed and outpatient follow up given.    This is a shared visit with the attending physician who personally evaluated the patient and agrees with the care plan.    Wynetta Emery, PA-C 10/24/14 1712  Benjiman Core, MD 10/26/14 0730

## 2014-10-23 NOTE — ED Notes (Signed)
Declined W/C at D/C and was escorted to lobby by RN. 

## 2014-10-23 NOTE — ED Notes (Signed)
Pt called with no response in main ER waiting room; was informed pt went to bathroom

## 2014-10-23 NOTE — ED Notes (Signed)
Pt c/o bilateral leg pain for 2 weeks. Denies any injuries. She noticed a knot on her R anterior calf area. Ambulatory, cms intact

## 2014-11-21 ENCOUNTER — Encounter (HOSPITAL_COMMUNITY): Payer: Self-pay

## 2014-11-21 ENCOUNTER — Emergency Department (HOSPITAL_COMMUNITY)
Admission: EM | Admit: 2014-11-21 | Discharge: 2014-11-21 | Disposition: A | Discharge status: Home / Self Care | Payer: Self-pay | Attending: Emergency Medicine | Admitting: Emergency Medicine

## 2014-11-21 ENCOUNTER — Emergency Department (HOSPITAL_COMMUNITY): Payer: Self-pay

## 2014-11-21 DIAGNOSIS — Z792 Long term (current) use of antibiotics: Secondary | ICD-10-CM | POA: Insufficient documentation

## 2014-11-21 DIAGNOSIS — Z8619 Personal history of other infectious and parasitic diseases: Secondary | ICD-10-CM | POA: Insufficient documentation

## 2014-11-21 DIAGNOSIS — M79641 Pain in right hand: Secondary | ICD-10-CM | POA: Insufficient documentation

## 2014-11-21 MED ORDER — IBUPROFEN 800 MG PO TABS: 800.0000 mg | ORAL_TABLET | Freq: Three times a day (TID) | ORAL | Status: DC | PRN

## 2014-11-21 NOTE — ED Notes (Signed)
Pt. Reports right hand pain x1.5 weeks. States seen at another hospital and told she has a fracture. States "I wanted to see if that was true and I also need a work note". Denies known injury. CNS intact distally

## 2014-11-21 NOTE — ED Notes (Signed)
Pt c/o R hand pain x 1.5 weeks; denies injury. Has had previous xray showing a fracture. CMS intact.

## 2014-11-21 NOTE — Discharge Instructions (Signed)
Read the information below.  Use the prescribed medication as directed.  Please discuss all new medications with your pharmacist.  You may return to the Emergency Department at any time for worsening condition or any new symptoms that concern you.  If you develop uncontrolled pain, weakness or numbness of the extremity, severe discoloration of the skin, or you are unable to use your hand, return to the ER for a recheck.      Pain of Unknown Etiology (Pain Without a Known Cause) You have come to your caregiver because of pain. Pain can occur in any part of the body. Often there is not a definite cause. If your laboratory (blood or urine) work was normal and X-rays or other studies were normal, your caregiver may treat you without knowing the cause of the pain. An example of this is the headache. Most headaches are diagnosed by taking a history. This means your caregiver asks you questions about your headaches. Your caregiver determines a treatment based on your answers. Usually testing done for headaches is normal. Often testing is not done unless there is no response to medications. Regardless of where your pain is located today, you can be given medications to make you comfortable. If no physical cause of pain can be found, most cases of pain will gradually leave as suddenly as they came.  If you have a painful condition and no reason can be found for the pain, it is important that you follow up with your caregiver. If the pain becomes worse or does not go away, it may be necessary to repeat tests and look further for a possible cause.  Only take over-the-counter or prescription medicines for pain, discomfort, or fever as directed by your caregiver.  For the protection of your privacy, test results cannot be given over the phone. Make sure you receive the results of your test. Ask how these results are to be obtained if you have not been informed. It is your responsibility to obtain your test  results.  You may continue all activities unless the activities cause more pain. When the pain lessens, it is important to gradually resume normal activities. Resume activities by beginning slowly and gradually increasing the intensity and duration of the activities or exercise. During periods of severe pain, bed rest may be helpful. Lie or sit in any position that is comfortable.  Ice used for acute (sudden) conditions may be effective. Use a large plastic bag filled with ice and wrapped in a towel. This may provide pain relief.  See your caregiver for continued problems. Your caregiver can help or refer you for exercises or physical therapy if necessary. If you were given medications for your condition, do not drive, operate machinery or power tools, or sign legal documents for 24 hours. Do not drink alcohol, take sleeping pills, or take other medications that may interfere with treatment. See your caregiver immediately if you have pain that is becoming worse and not relieved by medications. Document Released: 04/23/2001 Document Revised: 05/19/2013 Document Reviewed: 07/29/2005 [facility] Patient Information 2015 [place], [place]. This information is not intended to replace advice given to you by your health care provider. Make sure you discuss any questions you have with your health care provider.

## 2014-11-21 NOTE — ED Provider Notes (Signed)
CSN: [id number]     Arrival date & time 11/21/14  1916 History   First MD Initiated Contact with Patient 11/21/14 1955     Chief Complaint  Patient presents with  . Hand Pain     (Consider location/radiation/quality/duration/timing/severity/associated sxs/prior Treatment) The history is provided by the patient.     Patient presents with right hand pain since 11/12/14.  States she woke up with pain and swelling of the right hand.  Pain is located over 4th and 5th metacarpals radiating into 4th finger.  Pain with movement of fingers.  Denies weakness, numbness, arm swelling, CP, SOB, fevers.  Denies any known injury.  [place] and was told she had [place] fracture, came to ED today to find out if that was true.    Past Medical History  Diagnosis Date  . History of chicken pox   . Osgood-Schlatter's disease     hx  . Screening for chlamydial disease 09/2008    pos test  . Seasonal allergies   . Allergic rhinitis   . ASCUS (atypical squamous cells of undetermined significance) on Pap smear 01/08/2012    hpv pos   . Vaginal Pap smear, abnormal    Past Surgical History  Procedure Laterality Date  . No past surgeries     Family History  Problem Relation Age of Onset  . Other Mother     large ASD declining surgical intervention   History  Substance Use Topics  . Smoking status: Never Smoker   . Smokeless tobacco: Not on file  . Alcohol Use: No   OB History    Gravida Para Term Preterm AB TAB SAB Ectopic Multiple Living   0 0 0 0 0 0 0 0 0 0      Review of Systems  Constitutional: Negative for fever and chills.  Respiratory: Negative for shortness of breath.   Cardiovascular: Negative for chest pain.  Musculoskeletal: Negative for arthralgias.  Skin: Negative for color change, pallor and wound.  Allergic/Immunologic: Negative for immunocompromised state.  Neurological: Negative for weakness and numbness.  Hematological: Does not  bruise/bleed easily.  Psychiatric/Behavioral: Negative for self-injury.      Allergies  Review of patient's allergies indicates no known allergies.  Home Medications   Prior to Admission medications   Medication Sig Start Date End Date Taking? Authorizing Provider  doxycycline (VIBRA-TABS) 100 MG tablet Take 1 tablet (100 mg total) by mouth 2 (two) times daily. 05/20/14   Madelin HeadingsWanda K Panosh, MD  ondansetron (ZOFRAN-ODT) 4 MG disintegrating tablet Take 1 tablet (4 mg total) by mouth every 8 (eight) hours as needed for nausea or vomiting. 05/20/14   Madelin HeadingsWanda K Panosh, MD   BP 110/60 mmHg  Pulse 66  Temp(Src) 98 F (36.7 C) (Oral)  Resp 15  SpO2 98%  LMP 10/26/2014 Physical Exam  Constitutional: She appears well-developed and well-nourished. No distress.  HENT:  Head: Normocephalic and atraumatic.  Neck: Neck supple.  Pulmonary/Chest: Effort normal.  Musculoskeletal:  Right hand with tenderness throughout 4th and 5th metacarpals.  No edema, warmth, erythema.  No skin changes.  Sensation intact, capillary refill < 2 seconds.  Pt does not move 4th and 5th MCPs on exam secondary to pain.    Neurological: She is alert.  Skin: She is not diaphoretic.  Nursing note and vitals reviewed.   ED Course  Procedures (including critical care time) Labs Review Labs Reviewed - No data to display  Imaging Review  Dg Hand Complete Right  11/21/2014   CLINICAL DATA:  Right hand pain for 9 days.  No known injury.  EXAM: RIGHT HAND - COMPLETE 3+ VIEW  COMPARISON:  None.  FINDINGS: No fracture or dislocation. The alignment and joint spaces are maintained. Incidental notes of capitate hamate coalition, a normal variant. No erosion, periosteal reaction, or bony destructive change. No focal soft tissue abnormality.  IMPRESSION: 1. No acute osseous abnormality. 2. Incidental findings of capitate hamate coalition, a normal variant.   Electronically Signed   By: Rubye Oaks M.D.   On: 11/21/2014 20:51      EKG Interpretation None      MDM   Final diagnoses:  Right hand pain    Afebrile, nontoxic patient with right hand pain without injury.  Exam with edema, erythema, or warmth.  No skin changes.  Doubt infectious etiology.  Doubt DVT.  Xray negative.  No know injury.    D/C home with ace wrap, ibuprofen, conservative treatment.  Discussed result, findings, treatment, and follow up  with patient.  Pt given return precautions.  Pt verbalizes understanding and agrees with plan.         Trixie Dredge, PA-C 11/21/14 2125  Mancel Bale, MD 11/22/14 [phone]

## 2015-02-28 ENCOUNTER — Encounter (HOSPITAL_COMMUNITY): Payer: Self-pay | Admitting: Emergency Medicine

## 2015-02-28 ENCOUNTER — Emergency Department (HOSPITAL_COMMUNITY): Payer: Self-pay

## 2015-02-28 ENCOUNTER — Emergency Department (HOSPITAL_COMMUNITY)
Admission: EM | Admit: 2015-02-28 | Discharge: 2015-02-28 | Disposition: A | Discharge status: Home / Self Care | Payer: Self-pay | Attending: Emergency Medicine | Admitting: Emergency Medicine

## 2015-02-28 DIAGNOSIS — Y9289 Other specified places as the place of occurrence of the external cause: Secondary | ICD-10-CM | POA: Insufficient documentation

## 2015-02-28 DIAGNOSIS — X58XXXA Exposure to other specified factors, initial encounter: Secondary | ICD-10-CM | POA: Insufficient documentation

## 2015-02-28 DIAGNOSIS — S8392XA Sprain of unspecified site of left knee, initial encounter: Secondary | ICD-10-CM | POA: Insufficient documentation

## 2015-02-28 DIAGNOSIS — Y998 Other external cause status: Secondary | ICD-10-CM | POA: Insufficient documentation

## 2015-02-28 DIAGNOSIS — Z8619 Personal history of other infectious and parasitic diseases: Secondary | ICD-10-CM | POA: Insufficient documentation

## 2015-02-28 DIAGNOSIS — Y9389 Activity, other specified: Secondary | ICD-10-CM | POA: Insufficient documentation

## 2015-02-28 MED ORDER — OXYCODONE-ACETAMINOPHEN 5-325 MG PO TABS
2.0000 | ORAL_TABLET | Freq: Once | ORAL | Status: AC
  Administered 2015-02-28: 2 via ORAL
  Filled 2015-02-28: qty 2

## 2015-02-28 NOTE — ED Provider Notes (Signed)
CSN: [id number]     Arrival date & time 02/28/15  0359 History   First MD Initiated Contact with Patient 02/28/15 0409     Chief Complaint  Patient presents with  . Leg Pain     (Consider location/radiation/quality/duration/timing/severity/associated sxs/prior Treatment) Patient is a 24 y.o. female presenting with leg pain.  Leg Pain Location:  Knee Time since incident:  1 day Injury: yes   Mechanism of injury comment:  Stepped backwards hard Knee location:  L knee Pain details:    Quality:  Aching   Radiates to:  Does not radiate   Severity:  Moderate   Onset quality:  Gradual   Duration:  1 day   Timing:  Constant   Progression:  Unchanged Chronicity:  New Prior injury to area:  No Relieved by:  Nothing Worsened by:  Bearing weight, flexion and extension Ineffective treatments:  None tried Associated symptoms: decreased ROM (2/2 pain), stiffness and swelling   Associated symptoms: no back pain, no numbness and no tingling     Past Medical History  Diagnosis Date  . History of chicken pox   . Osgood-Schlatter's disease     hx  . Screening for chlamydial disease 09/2008    pos test  . Seasonal allergies   . Allergic rhinitis   . ASCUS (atypical squamous cells of undetermined significance) on Pap smear 01/08/2012    hpv pos   . Vaginal Pap smear, abnormal    Past Surgical History  Procedure Laterality Date  . No past surgeries     Family History  Problem Relation Age of Onset  . Other Mother     large ASD declining surgical intervention   History  Substance Use Topics  . Smoking status: Never Smoker   . Smokeless tobacco: Not on file  . Alcohol Use: No   OB History    Gravida Para Term Preterm AB TAB SAB Ectopic Multiple Living       Review of Systems  Musculoskeletal: Positive for stiffness. Negative for back pain.  All other systems reviewed and are negative.     Allergies  Review of patient's allergies indicates no known  allergies.  Home Medications   Prior to Admission medications   Medication Sig Start Date End Date Taking? Authorizing Provider  doxycycline (VIBRA-TABS) 100 MG tablet Take 1 tablet (100 mg total) by mouth 2 (two) times daily. Patient not taking: Reported on 02/28/2015 05/20/14   Madelin Headings, MD  ibuprofen (ADVIL,MOTRIN) 800 MG tablet Take 1 tablet (800 mg total) by mouth every 8 (eight) hours as needed for mild pain or moderate pain. Patient not taking: Reported on 02/28/2015 11/21/14   Trixie Dredge, PA-C  ondansetron (ZOFRAN-ODT) 4 MG disintegrating tablet Take 1 tablet (4 mg total) by mouth every 8 (eight) hours as needed for nausea or vomiting. Patient not taking: Reported on 02/28/2015 05/20/14   Madelin Headings, MD   BP 109/68 mmHg  Pulse 66  Temp(Src) 98.6 F (37 C) (Oral)  Resp 18  SpO2 98%  LMP 01/25/2015 Physical Exam  Constitutional: She is oriented to person, place, and time. She appears well-developed and well-nourished.  HENT:  Head: Normocephalic and atraumatic.  Right Ear: External ear normal.  Left Ear: External ear normal.  Eyes: Conjunctivae and EOM are normal. Pupils are equal, round, and reactive to light.  Neck: Normal range of motion. Neck supple.  Cardiovascular: Normal rate, regular rhythm, normal heart  sounds and intact distal pulses.   Pulmonary/Chest: Effort normal and breath sounds normal.  Abdominal: Soft. Bowel sounds are normal. There is no tenderness.  Musculoskeletal:       Left knee: She exhibits decreased range of motion (2/2 pain, full passive ROM). She exhibits no LCL laxity and no MCL laxity. Tenderness found. Lateral joint line tenderness noted.  Neurological: She is alert and oriented to person, place, and time.  Skin: Skin is warm and dry.  Vitals reviewed.   ED Course  Procedures (including critical care time) Labs Review Labs Reviewed - No data to display  Imaging Review Dg Knee Complete 4 Views Left  02/28/2015   CLINICAL DATA:   Acute onset of left knee pain, after twisting injury. Initial encounter.  EXAM: LEFT KNEE - COMPLETE 4+ VIEW  COMPARISON:  None.  FINDINGS: There is no evidence of fracture or dislocation. The joint spaces are preserved. No significant degenerative change is seen; the patellofemoral joint is grossly unremarkable in appearance.  No significant joint effusion is seen. The visualized soft tissues are normal in appearance.  IMPRESSION: No evidence of fracture or dislocation.   Electronically Signed   By: Roanna RaiderJeffery  Chang M.D.   On: 02/28/2015 05:53     EKG Interpretation None      MDM   Final diagnoses:  Knee sprain, left, initial encounter    24 y.o. female without pertinent PMH presents with left knee pain as above. Exam as above, patient has limited range of motion secondary to pain however on repeat prompting is able to fully range her joint. No lacerations. Workup as above unremarkable, suspect joint sprain. Discussed possibility of ligamentous injury at length with the patient and patient was given standard return precautions. She was also provided with a knee immobilizer and crutches. She will follow-up with orthopedics. She voiced understanding of precautions agreed to follow-up..    I have reviewed all laboratory and imaging studies if ordered as above  1. Knee sprain, left, initial encounter         Mirian MoMatthew Gentry, MD 02/28/15 [phone]

## 2015-02-28 NOTE — ED Notes (Signed)
Pt. lost her balance while walking backwards at work this morning reports pain at left lower leg and right knee , denies fall / ambulatory .

## 2015-02-28 NOTE — Care Management (Signed)
ED CM received call from patient concerning discharge prescription. CM verified there were no discharge prescriptions written patient verbalized understanding. No further ED CM needs identified.

## 2015-02-28 NOTE — ED Notes (Signed)
Patient transported to X-ray 

## 2015-02-28 NOTE — Discharge Instructions (Signed)

## 2015-06-21 ENCOUNTER — Telehealth: Payer: Self-pay | Admitting: Internal Medicine

## 2015-06-21 NOTE — Telephone Encounter (Signed)
Spoke to the pt.  Informed her that the last depo provera injection that she received from this office was on 03/01/2013.  She does not require referral for gynecology.  Instructed pt to call the office that she would like to be seen in and make sure that the office accepts her insurance.

## 2015-06-21 NOTE — Telephone Encounter (Signed)
Patient Name: Edrick KinsRDREANNA JOHNSON  DOB: 09/07/1990    Initial Comment Caller has questions regarding her being on Depo   Nurse Assessment  Nurse: Stefano GaulStringer, RN, Dwana CurdVera Date/Time Lamount Cohen(Eastern Time): 06/21/2015 11:25:18 AM  Confirm and document reason for call. If symptomatic, describe symptoms. ---Caller states she started taking the Depo injection around 2010 and took it for 3-4 yrs. She is no longer taking Depo injections for 1-2 yrs. She wants to get pregnant. Her periods are regular.  Has the patient traveled out of the country within the last 30 days? ---No  Does the patient have any new or worsening symptoms? ---No  Please document clinical information provided and list any resource used. ---Advised caller as per www.parents.com/getting-[email or web address]/trying...conceive/getting-pregnant-after-depo-prover...that some women may take up to a yr to get pregnant and some as soon as 4 months but the average time is about 10 months.     Guidelines    Guideline Title Affirmed Question Affirmed Notes       Final Disposition User   Clinical Call Stringer, RN, Vera    Comments  Caller would like to know exactly when she stopped taking her depo injections and would like a referral to a GYN. Advised caller that someone from the office will call her back concerning a GYN referral and the dates she stopped taking the Depo injection. Verbalized understanding.    Verbalized understanding that some women may take up to a yr to get pregnant after stopping Depo and some as soon as 4 months but the average time is 10 months.

## 2015-10-06 ENCOUNTER — Encounter: Payer: Self-pay | Admitting: Obstetrics and Gynecology

## 2015-10-06 ENCOUNTER — Ambulatory Visit (INDEPENDENT_AMBULATORY_CARE_PROVIDER_SITE_OTHER): Payer: BLUE CROSS/BLUE SHIELD | Admitting: Obstetrics and Gynecology

## 2015-10-06 VITALS — BP 102/66 | HR 70 | Resp 20 | Ht 64.0 in | Wt 146.4 lb

## 2015-10-06 DIAGNOSIS — Z113 Encounter for screening for infections with a predominantly sexual mode of transmission: Secondary | ICD-10-CM | POA: Diagnosis not present

## 2015-10-06 DIAGNOSIS — Z01419 Encounter for gynecological examination (general) (routine) without abnormal findings: Secondary | ICD-10-CM

## 2015-10-06 DIAGNOSIS — N979 Female infertility, unspecified: Secondary | ICD-10-CM | POA: Diagnosis not present

## 2015-10-06 LAB — HEPATITIS C ANTIBODY: HCV Ab: NEGATIVE

## 2015-10-06 NOTE — Patient Instructions (Signed)
EXERCISE AND DIET:  We recommended that you start or continue a regular exercise program for good health. Regular exercise means any activity that makes your heart beat faster and makes you sweat.  We recommend exercising at least 30 minutes per day at least 3 days a week, preferably 4 or 5.  We also recommend a diet low in fat and sugar.  Inactivity, poor dietary choices and obesity can cause diabetes, heart attack, stroke, and kidney damage, among others.    ALCOHOL AND SMOKING:  Women should limit their alcohol intake to no more than 7 drinks/beers/glasses of wine (combined, not each!) per week. Moderation of alcohol intake to this level decreases your risk of breast cancer and liver damage. And of course, no recreational drugs are part of a healthy lifestyle.  And absolutely no smoking or even second hand smoke. Most people know smoking can cause heart and lung diseases, but did you know it also contributes to weakening of your bones? Aging of your skin?  Yellowing of your teeth and nails?  CALCIUM AND VITAMIN D:  Adequate intake of calcium and Vitamin D are recommended.  The recommendations for exact amounts of these supplements seem to change often, but generally speaking 600 mg of calcium (either carbonate or citrate) and 800 units of Vitamin D per day seems prudent. Certain women may benefit from higher intake of Vitamin D.  If you are among these women, your doctor will have told you during your visit.    PAP SMEARS:  Pap smears, to check for cervical cancer or precancers,  have traditionally been done yearly, although recent scientific advances have shown that most women can have pap smears less often.  However, every woman still should have a physical exam from her gynecologist every year. It will include a breast check, inspection of the vulva and vagina to check for abnormal growths or skin changes, a visual exam of the cervix, and then an exam to evaluate the size and shape of the uterus and  ovaries.  And after 26 years of age, a rectal exam is indicated to check for rectal cancers. We will also provide age appropriate advice regarding health maintenance, like when you should have certain vaccines, screening for sexually transmitted diseases, bone density testing, colonoscopy, mammograms, etc.   MAMMOGRAMS:  All women over 40 years old should have a yearly mammogram. Many facilities now offer a "3D" mammogram, which may cost around $50 extra out of pocket. If possible,  we recommend you accept the option to have the 3D mammogram performed.  It both reduces the number of women who will be called back for extra views which then turn out to be normal, and it is better than the routine mammogram at detecting truly abnormal areas.    COLONOSCOPY:  Colonoscopy to screen for colon cancer is recommended for all women at age 50.  We know, you hate the idea of the prep.  We agree, BUT, having colon cancer and not knowing it is worse!!  Colon cancer so often starts as a polyp that can be seen and removed at colonscopy, which can quite literally save your life!  And if your first colonoscopy is normal and you have no family history of colon cancer, most women don't have to have it again for 10 years.  Once every ten years, you can do something that may end up saving your life, right?  We will be happy to help you get it scheduled when you are ready.    Be sure to check your insurance coverage so you understand how much it will cost.  It may be covered as a preventative service at no cost, but you should check your particular policy.     Infertility Infertility is when you are unable to get pregnant (conceive) after a year of having sex regularly without using birth control. Infertility can also mean that a woman is not able to carry a pregnancy to full term.  Both women and men can have fertility problems. WHAT CAUSES INFERTILITY? What Causes Infertility in Women? There are many possible causes of  infertility in women. For some women, the cause of infertility is not known (unexplained infertility). Infertility can also be linked to more than one cause. Infertility problems in women can be caused by problems with the menstrual cycle or reproductive organs, certain medical conditions, and factors related to lifestyle and age.  Problems with your menstrual cycle can interfere with your ovaries producing eggs (ovulation). This can make it difficult to get pregnant. This includes having a menstrual cycle that is very long, very short, or irregular.  Problems with reproductive organs can include:  An abnormally narrow cervix or a cervix that does not remain closed during a pregnancy.  A blockage in your fallopian tubes.  An abnormally shaped uterus.  Uterine fibroids. This is a tissue mass (tumor) that can develop on your uterus.  Medical conditions that can affect a woman's fertility include:  Polycystic ovarian syndrome (PCOS). This is a hormonal disorder that can cause small cysts to grow on your ovaries. This is the most common cause of infertility in women.  Endometriosis. This is a condition in which the tissue that lines your uterus (endometrium) grows outside of its normal location.  Primary ovary insufficiency. This is when your ovaries stop producing eggs and hormones before the age of 68.  Sexually transmitted diseases, such as chlamydia or gonorrhea. These infections can cause scarring in your fallopian tubes. This makes it difficult for eggs to reach your uterus.  Autoimmune disorders. These are disorders in which your immune system attacks normal, healthy cells.  Hormone imbalances.  Other factors include:  Age. A woman's fertility declines with age, especially after her mid-30s.  Being under- or overweight.  Drinking too much alcohol.  Using drugs.  Exercising excessively.  Being exposed to environmental toxins, such as radiation, pesticides, and certain  chemicals. What Causes Infertility in Men? There are many causes of infertility in men. Infertility can be linked to more than one cause. Infertility problems in men can be caused by problems with sperm or the reproductive organs, certain medical conditions, and factors related to lifestyle and age. Some men have unexplained infertility.   Problems with sperm. Infertility can result if there is a problem producing:  Enough sperm (low sperm count).  Enough normally-shaped sperm (sperm morphology).  Sperm that are able to reach the egg (poor motility).  Infertility can also be caused by:  A problem with hormones.  Enlarged veins (varicoceles), cysts (spermatoceles), or tumors of the testicles.  Sexual dysfunction.  Injury to the testicles.  A birth defect, such as not having the tubes that carry sperm (vas deferens).  Medical conditions that can affect a man's fertility include:  Diabetes.  Cancer treatments, such as chemotherapy or radiation.  Klinefelter syndrome. This is an inherited genetic disorder.   Thyroid problems, such as an under- or overactive thyroid.  Cystic fibrosis.  Sexually transmitted diseases.  Other factors include:  Age. A man's  fertility declines with age.  Drinking too much alcohol.  Using drugs.  Being exposed to environmental toxins, such as pesticides and lead. WHAT ARE THE SYMPTOMS OF INFERTILITY? Being unable to get pregnant after one year of having regular sex without using birth control is the only sign of infertility.  HOW IS INFERTILITY DIAGNOSED? In order to be diagnosed with infertility, both partners will have a physical exam. Both partners will also have an extensive medical and sexual history taken. If there is no obvious reason for infertility, additional tests may be done. What Tests Will Women Have? Women may first have tests to check whether they are ovulating each month. The tests may include:  Blood tests to check  hormone levels.  An ultrasound of the ovaries. This looks for possible problems on or in the ovaries.  Taking a small sample of the tissue that lines the uterus for examination under a microscope (endometrial biopsy). Women who are ovulating may have additional tests. These may include:  Hysterosalpingography.  This is an X-ray of the fallopian tubes and uterus taken after a specific type of dye is injected.  This test can show the shape of the uterus and whether the fallopian tubes are open.  Laparoscopy.  In this test, a lighted tube (laparoscope) is used to look for problems in the fallopian tubes and other female organs.  Transvaginal ultrasound.  This is an imaging test to check for abnormalities of the uterus and ovaries.  A health care provider can use this test to count the number of follicles on the ovaries.  Hysteroscopy.  This test involves using a lighted tube to examine the cervix and inside the uterus.  It is done to find any abnormalities inside the uterus. What Tests Will Men Have? Tests for men's infertility includes:  Semen tests to check sperm count, morphology, and motility.  Blood tests to check for hormone levels.  Taking a small sample of tissue from inside a testicle (biopsy). This is examined under a microscope.  Blood tests to check for genetic abnormalities (genetic testing). HOW ARE WOMEN TREATED FOR INFERTILITY?  Treatment depends on the cause of infertility. Most cases of infertility in women are treated with medicine or surgery.  Women may take medicine to:  Correct ovulation problems.  Treat other health conditions, such as PCOS.  Surgery may be done to:  Repair damage to the ovaries, fallopian tubes, cervix, or uterus.  Remove growths from the uterus.  Remove scar tissue from the uterus, pelvis, or other female organs. HOW ARE MEN TREATED FOR INFERTILITY?  Treatment depends on the cause of infertility. Most cases of infertility  in men are treated with medicine or surgery.   Men may take medicine to:  Correct hormone problems.  Treat other health conditions.  Treat sexual dysfunction.  Surgery may be done to:  Remove blockages in the reproductive tract.  Correct other structural problems of the reproductive tract. WHAT IS ASSISTED REPRODUCTIVE TECHNOLOGY? Assisted reproductive technology (ART) refers to all treatments and procedures that combine eggs and sperm outside the body to try to help a couple conceive. ART is often combined with fertility drugs to stimulate ovulation. Sometimes ART is done using eggs retrieved from another woman's body (donor eggs) or from previously frozen fertilized eggs (embryos).  There are different types of ART. These include:   Intrauterine insemination (IUI).  In this procedure, sperm is placed directly into a woman's uterus with a long, thin tube.  This may be most effective  for infertility caused by sperm problems, including low sperm count and low motility.  Can be used in combination with fertility drugs.  In vitro fertilization (IVF).  This is often done when a woman's fallopian tubes are blocked or when a man has low sperm counts.  Fertility drugs stimulate the ovaries to produce multiple eggs. Once mature, these eggs are removed from the body and combined with the sperm to be fertilized.  These fertilized eggs are then placed in the woman's uterus.   This information is not intended to replace advice given to you by your health care provider. Make sure you discuss any questions you have with your health care provider.   Document Released: 08/01/2003 Document Revised: 04/19/2015 Document Reviewed: 04/13/2014 Elsevier Interactive Patient Education [employer]

## 2015-10-06 NOTE — Progress Notes (Signed)
Referral faxed to Dr. Lyndal Rainbow office for semen analysis appiontment and patient given instructions and sampling cups to patient.  Patient is asked to call on the first day of her next cycle to schedule HSG on cycle days 7-10. Will need Order for doxycyline from Dr. Edward Jolly.  Patient advised we will contact her with results of semen analysis as soon as they are reviewed by Dr. Edward Jolly.

## 2015-10-06 NOTE — Progress Notes (Signed)
Patient ID: Christine Rose, female   DOB: [birth year], 25 y.o.   MRN: [id number] 25 y.o. [id number] Single African American female here for annual exam.    Patient also wishes evaluation and consultation for fertility.  Trying for pregnancy for over one year.  Off Depo Provera for 18 months and then went on OCPs for one month.  Was on Depo Provera for 3 - 4 years.   Partner has fathered 2 children.  Wants to do STD testing.   When has her menses, "her bones hurt." Menses last 7 days, first 3 are light.  Menses are monthly for the last 15 months. Not certain if she is ovulating every month.  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  Will go to school for elem ed.  Wants to be an MD.   PCP:   Berniece Andreas, MD  Patient's last menstrual period was 09/23/2015 (exact date).          Sexually active: Yes.   female The current method of family planning is none--trying for pregnancy.    Exercising: No.   Smoker:  no  Health Maintenance: Pap:  05-20-14 Neg History of abnormal Pap:  Yes, 12/2011 ASCUS:Pos HR HPV and returned for repeat pap 09-21-12 which was ASCUS and advised to see GYN but never followed up.  No treatment.  Thinks she had chlamydia in the past.  It was treated.  MMG:  n/a Colonoscopy:  n/a BMD:   n/a  Result  n/a TDaP:  Td 05-22-06 Screening Labs:  Hb today: PCP, Urine today: unable to void   reports that she has never smoked. She does not have any smokeless tobacco history on file. She reports that she does not drink alcohol or use illicit drugs.  Past Medical History  Diagnosis Date  . History of chicken pox   . Osgood-Schlatter's disease     hx  . Screening for chlamydial disease 09/2008    pos test  . Seasonal allergies   . Allergic rhinitis   . ASCUS (atypical squamous cells of undetermined significance) on Pap smear 01/08/2012    hpv pos,follow up pap 09-21-12 Ascus, pap 05-20-14 Neg  . Vaginal Pap smear, abnormal     Past Surgical History  Procedure  Laterality Date  . No past surgeries      No current outpatient prescriptions on file.   No current facility-administered medications for this visit.    Family History  Problem Relation Age of Onset  . Other Mother     large ASD declining surgical intervention  . Stroke Mother 68  . Breast cancer Maternal Grandmother 21  . Diabetes Maternal Grandmother   . Hypertension Maternal Grandmother   . Diabetes Maternal Grandfather   . Hypertension Maternal Grandfather   . Diabetes Paternal Grandmother   . Hypertension Paternal Grandmother   . Diabetes Paternal Grandfather   . Hypertension Paternal Grandfather     ROS:  Pertinent items are noted in HPI.  Otherwise, a comprehensive ROS was negative.  Exam:   BP 102/66 mmHg  Pulse 70  Resp 20  Ht  (1.626 m)  Wt 146 lb 6.4 oz (66.407 kg)  BMI 25.12 kg/m2  LMP 09/23/2015 (Exact Date)    General appearance: alert, cooperative and appears stated age Head: Normocephalic, without obvious abnormality, atraumatic Neck: no adenopathy, supple, symmetrical, trachea midline and thyroid normal to inspection and palpation Lungs: clear to auscultation bilaterally Breasts: normal appearance, no masses or tenderness, Inspection negative, No nipple  retraction or dimpling, No nipple discharge or bleeding, No axillary or supraclavicular adenopathy Heart: regular rate and rhythm Abdomen: soft, non-tender; bowel sounds normal; no masses,  no organomegaly Extremities: extremities normal, atraumatic, no cyanosis or edema Skin: Skin color, texture, turgor normal. No rashes or lesions Lymph nodes: Cervical, supraclavicular, and axillary nodes normal. No abnormal inguinal nodes palpated Neurologic: Grossly normal  Pelvic: External genitalia:  no lesions              Urethra:  normal appearing urethra with no masses, tenderness or lesions              Bartholins and Skenes: normal                 Vagina: normal appearing vagina with normal color and  discharge, no lesions              Cervix: no lesions              Pap taken: Yes.   Bimanual Exam:  Uterus:  normal size, contour, position, consistency, mobility, non-tender              Adnexa: normal adnexa and no mass, fullness, tenderness              Rectovaginal: No..  Confirms.              Anus:  normal sphincter tone, no lesions  Chaperone was present for exam.  Assessment:   Well woman visit with normal exam. Hx ASCUS and positive HR HPV.  Female infertility. Hx of chlamydia.  Treated.  Plan: Yearly mammogram recommended after age 49.  Recommended self breast exam.  Pap and HR HPV as above. Discussed Calcium, Vitamin D, regular exercise program including cardiovascular and weight bearing exercise. Labs performed.  Yes.  .   See orders.  STD testing.   Refills given on medications.  No.    Return for progesterone level check on October 13, 2015.  Semen analysis and HSG ordered.  Procedures explained. PNV. Follow up after testing has returned so a plan can be discussed.  Annual exam in one year.  Additional 20 minutes of counseling given regarding infertility - evaluation and treatment discussed. Over 50% was spent in counseling.  Today's visit was not just a routine annual exam.  An annual exam was done in addition to a problem visit.   After visit summary provided.

## 2015-10-07 LAB — STD PANEL
HIV: NONREACTIVE
Hepatitis B Surface Ag: NEGATIVE

## 2015-10-08 MED ORDER — DOXYCYCLINE HYCLATE 100 MG PO CAPS: ORAL_CAPSULE | ORAL | Status: DC

## 2015-10-09 LAB — IPS PAP TEST WITH REFLEX TO HPV

## 2015-10-10 LAB — IPS N GONORRHOEA AND CHLAMYDIA BY PCR

## 2015-10-11 ENCOUNTER — Encounter: Payer: Self-pay | Admitting: Obstetrics and Gynecology

## 2015-10-12 ENCOUNTER — Telehealth: Payer: Self-pay

## 2015-10-12 MED ORDER — AZITHROMYCIN 1 G PO PACK
1.0000 | PACK | Freq: Once | ORAL | Status: DC
Start: 2015-10-12 — End: 2017-09-25

## 2015-10-12 MED ORDER — METRONIDAZOLE 500 MG PO TABS: 500.0000 mg | ORAL_TABLET | Freq: Two times a day (BID) | ORAL | Status: DC

## 2015-10-12 NOTE — Telephone Encounter (Signed)
Please cancel the HSG order.

## 2015-10-12 NOTE — Telephone Encounter (Signed)
Spoke with patient. Advised of results and message as seen below from Dr.Silva. She is agreeable and verbalizes understanding of all results. Rx for Azithromycin 1 gram orally and Flagyl 500 mg po bid for 7 days #14 0RF sent to pharmacy on file. ETOH precaution given. Order for HSG cancelled. Aware she may not proceed with HSG testing until after she has had a test of cure for chlamydia in 4 weeks. She is agreeable. 1 month follow up appointment scheduled for 11/09/2015 at 3 pm with Dr.Silva. Aware her partner will also need treatment and should not proceed with semen analysis until after treatment is completed. Patient is scheduled for progesterone lab check on 10/13/2015 and would like to know if she needs to keep this. Advised she will need to keep this appointment. She is agreeable. Form sent to the [facility] with cover sheet and confirmation.  Routing to provider for final review. Patient agreeable to disposition. Will close encounter.

## 2015-10-12 NOTE — Telephone Encounter (Signed)
Order for HSG cancelled.

## 2015-10-12 NOTE — Telephone Encounter (Signed)
-----   Message from Patton Salles, MD sent at 10/11/2015  9:33 PM EST ----- Please contact patient with results.   She has 2 infections to treat: 1 - chlamydia.  This will be treated with azithromycin 1 gram orally.  Please report to Health Dept. 2 - bacterial vaginosis noted on her pap smear, which was otherwise normal.   I recommend Flagyl 500 mg po bid for 7 days.   Give ETOH precautions.   Testing for HIV, Hep B and C, syphilis, and gonorrhea were negative.   PATIENT MAY NOT PROCEED WITH HYSTEROSALPINGOGRAM UNTIL AFTER SHE HAS A NEGATIVE TEST OF CURE FOR CHLAMYDIA TESTING  WHICH WILL BE DONE IN 4 WEEKS. PLEASE SCHEDULE THIS FOLLOW UP WITH ME. PLEASE DISCONTINUE THE HSG ORDER. HSG CANNOT OCCUR UNTIL WE ARE SURE SHE HAS NO FURTHER CHLAMYDIA INFECTION.  Patient's partner needs evaluation and treatment also for chlamydia.  I do not recommend proceeding with semen analysis until after his treatment is completed.  Cc- Claudette Laws

## 2015-10-13 ENCOUNTER — Other Ambulatory Visit (INDEPENDENT_AMBULATORY_CARE_PROVIDER_SITE_OTHER): Payer: BLUE CROSS/BLUE SHIELD

## 2015-10-13 DIAGNOSIS — N979 Female infertility, unspecified: Secondary | ICD-10-CM

## 2015-10-14 LAB — PROGESTERONE: Progesterone: 2.7 ng/mL

## 2015-10-17 ENCOUNTER — Telehealth: Payer: Self-pay

## 2015-10-17 NOTE — Telephone Encounter (Signed)
Spoke with patient. Advised of message as seen below from Dr.Silva. Patient states that she is unable to be seen in the office today for repeat lab appointment due to her schedule. Patient would like to proceed with using ovulation predictor kits next month. Patient is scheduled for TOC on 11/09/2015 with Dr.Silva.   Routing to Dr.Silva for review. Anything further recommendations for this patient?

## 2015-10-17 NOTE — Telephone Encounter (Signed)
-----  Message from Nunzio Cobbs, MD sent at 10/17/2015  7:39 AM EST ----- Please inform patient of her results of the progesterone.  This is a little on the low side for ovulation. It would be helpful to measure again today, because it is possible that the level will go up further and confirm ovulation. If she is not able to come in for blood work, we can just have her do an urine ovulation kit with the next month. If she is not ovulating, medication can be prescribed to help with this.   We will proceed forward with her fertility care after her test of cure of the chlamydia, completing the HSG, and her partner completes the semen analysis.  Cc - Marisa Sprinkles

## 2015-10-18 NOTE — Telephone Encounter (Signed)
Nothing further at this time.  Encounter closed.

## 2015-11-09 ENCOUNTER — Encounter: Payer: Self-pay | Admitting: Obstetrics and Gynecology

## 2015-11-09 ENCOUNTER — Telehealth: Payer: Self-pay | Admitting: Obstetrics and Gynecology

## 2015-11-09 NOTE — Telephone Encounter (Signed)
patient canceled appointment for today and will call back to reschedule appointment. She was unable to keep appointment due to wait time and had to leave before being seen.

## 2015-11-09 NOTE — Telephone Encounter (Signed)
I am sorry for the patient's wait today.  I have closed the encounter.

## 2015-11-09 NOTE — Progress Notes (Signed)
Error

## 2016-01-16 ENCOUNTER — Telehealth: Payer: Self-pay | Admitting: Obstetrics and Gynecology

## 2016-01-16 NOTE — Telephone Encounter (Signed)
Christine [facility] with [facility] calling because she has not heard from the patient and will shred the referral that was sent in February.

## 2016-01-17 NOTE — Telephone Encounter (Signed)
Noted.  Will close encounter.  

## 2016-05-16 ENCOUNTER — Encounter: Payer: Self-pay | Admitting: Obstetrics and Gynecology

## 2016-06-19 ENCOUNTER — Encounter: Payer: Self-pay | Admitting: Obstetrics and Gynecology

## 2016-10-11 ENCOUNTER — Ambulatory Visit: Payer: BLUE CROSS/BLUE SHIELD | Admitting: Obstetrics and Gynecology

## 2017-03-03 DIAGNOSIS — S161XXA Strain of muscle, fascia and tendon at neck level, initial encounter: Secondary | ICD-10-CM | POA: Diagnosis not present

## 2017-03-03 DIAGNOSIS — Y9389 Activity, other specified: Secondary | ICD-10-CM | POA: Diagnosis not present

## 2017-03-03 DIAGNOSIS — Y999 Unspecified external cause status: Secondary | ICD-10-CM | POA: Insufficient documentation

## 2017-03-03 DIAGNOSIS — Y92410 Unspecified street and highway as the place of occurrence of the external cause: Secondary | ICD-10-CM | POA: Insufficient documentation

## 2017-03-03 DIAGNOSIS — S199XXA Unspecified injury of neck, initial encounter: Secondary | ICD-10-CM | POA: Diagnosis present

## 2017-03-03 LAB — POCT PREGNANCY, URINE: PREG TEST UR: NEGATIVE

## 2017-03-03 NOTE — ED Notes (Signed)
Spoke with Dr Derrill KayGoodman regarding pt's presenting c/o's and s/x's; no orders for blood work or imaging at this time. VORB for POC urine pregnancy test to be entered and carried out by this RN.
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
## 2017-03-04 ENCOUNTER — Emergency Department: Payer: No Typology Code available for payment source

## 2017-03-04 ENCOUNTER — Emergency Department
Admission: EM | Admit: 2017-03-04 | Discharge: 2017-03-04 | Disposition: A | Discharge status: Home / Self Care | Payer: No Typology Code available for payment source | Attending: Emergency Medicine | Admitting: Emergency Medicine

## 2017-03-04 DIAGNOSIS — S161XXA Strain of muscle, fascia and tendon at neck level, initial encounter: Secondary | ICD-10-CM

## 2017-03-04 MED ORDER — IBUPROFEN 600 MG PO TABS
600.0000 mg | ORAL_TABLET | Freq: Three times a day (TID) | ORAL | 0 refills | Status: DC | PRN
Start: 2017-03-04 — End: 2017-09-25

## 2017-03-04 MED ORDER — DIAZEPAM 2 MG PO TABS: 2.0000 mg | ORAL_TABLET | Freq: Three times a day (TID) | ORAL | 0 refills | Status: DC | PRN

## 2017-03-04 MED ORDER — DIAZEPAM 2 MG PO TABS
2.0000 mg | ORAL_TABLET | Freq: Once | ORAL | Status: AC
  Administered 2017-03-04: 2 mg via ORAL
  Filled 2017-03-04: qty 1

## 2017-03-04 MED ORDER — KETOROLAC TROMETHAMINE 30 MG/ML IJ SOLN
30.0000 mg | Freq: Once | INTRAMUSCULAR | Status: DC
  Filled 2017-03-04: qty 1

## 2017-03-04 NOTE — Discharge Instructions (Signed)
1. Take medicines as needed for pain (Motrin/Valium #15). 2. Apply moist heat to affected area several times daily. 3. Return to the ER for worsening symptoms, persistent vomiting, difficulty breathing or other concerns.
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
## 2017-08-12 NOTE — L&D Delivery Note (Addendum)
Operative Delivery Note Patient pushed well for 35 minutes.  At 4:00 AM a viable female was delivered via Vaginal, Spontaneous.  Presentation: vertex; Position: Right,, Occiput,, Anterior.  The head delivered slowly.  Following delivery of the head, the anterior (left) shoulder did not deliver easily.  The patient was repositioned and the shoulder still did not deliver with the usual gentle traction.  Shoulder dystocia was called.  The patient was instructed to stop pushing.  Suprapubic pressure and McRoberts maneuver were employed, and the anterior should delivered, followed by the posterior shoulder and body.  Baby was placed skin to skin with mom and was vigorous with warm / dry stimulation. Baby was moving all extremities well.  Total time of shoulder dystocia was 30 seconds  Delivery of the head: 07/07/2018  3:59 AM First maneuver: 07/07/2018  3:59 AM, McRoberts Second maneuver: 07/07/2018  4:00 AM, Suprapubic Pressure McRoberts     APGAR: 8, 9; weight 2991 gm (6lb 9.5oz).   Placenta status: Spontaneous, in tact.   Cord: 3V   Cord pH: n/a  Anesthesia:  Epidural Episiotomy: None Lacerations: Periurethral Suture Repair: 3.0 vicryl rapide Est. Blood Loss (mL): 88  Mom to postpartum.  Baby to Couplet care / Skin to Skin.  Kemesha Mosey GEFFEL Amalea Ottey 07/07/2018, 4:20 AM

## 2017-09-06 ENCOUNTER — Other Ambulatory Visit: Payer: Self-pay

## 2017-09-06 ENCOUNTER — Encounter (HOSPITAL_COMMUNITY): Payer: Self-pay

## 2017-09-06 DIAGNOSIS — Z3A10 10 weeks gestation of pregnancy: Secondary | ICD-10-CM | POA: Insufficient documentation

## 2017-09-06 DIAGNOSIS — O209 Hemorrhage in early pregnancy, unspecified: Secondary | ICD-10-CM | POA: Diagnosis present

## 2017-09-06 DIAGNOSIS — O200 Threatened abortion: Secondary | ICD-10-CM | POA: Insufficient documentation

## 2017-09-06 NOTE — ED Notes (Signed)
States does not have obgyn at this time.

## 2017-09-06 NOTE — ED Triage Notes (Signed)
States 3 month pregnant with spotting blood for about 2 days and lower abdominal pain voiced no clots noted.

## 2017-09-07 ENCOUNTER — Emergency Department (HOSPITAL_COMMUNITY): Payer: Medicaid Other

## 2017-09-07 ENCOUNTER — Emergency Department (HOSPITAL_COMMUNITY)
Admission: EM | Admit: 2017-09-07 | Discharge: 2017-09-07 | Disposition: A | Discharge status: Home / Self Care | Payer: Medicaid Other | Attending: Emergency Medicine | Admitting: Emergency Medicine

## 2017-09-07 DIAGNOSIS — N939 Abnormal uterine and vaginal bleeding, unspecified: Secondary | ICD-10-CM

## 2017-09-07 DIAGNOSIS — O200 Threatened abortion: Secondary | ICD-10-CM

## 2017-09-07 LAB — CBC WITH DIFFERENTIAL/PLATELET
BASOS PCT: 1 %
Basophils Absolute: 0.1 10*3/uL (ref 0.0–0.1)
Eosinophils Absolute: 0.1 10*3/uL (ref 0.0–0.7)
Eosinophils Relative: 2 %
HEMATOCRIT: 37.8 % (ref 36.0–46.0)
Hemoglobin: 12.2 g/dL (ref 12.0–15.0)
Lymphocytes Relative: 31 %
Lymphs Abs: 2.4 10*3/uL (ref 0.7–4.0)
MCH: 27.9 pg (ref 26.0–34.0)
MCHC: 32.3 g/dL (ref 30.0–36.0)
MCV: 86.5 fL (ref 78.0–100.0)
Monocytes Absolute: 0.7 10*3/uL (ref 0.1–1.0)
Monocytes Relative: 9 %
NEUTROS ABS: 4.4 10*3/uL (ref 1.7–7.7)
Neutrophils Relative %: 57 %
Platelets: 311 10*3/uL (ref 150–400)
RBC: 4.37 MIL/uL (ref 3.87–5.11)
RDW: 13.9 % (ref 11.5–15.5)
WBC: 7.7 10*3/uL (ref 4.0–10.5)

## 2017-09-07 LAB — URINALYSIS, ROUTINE W REFLEX MICROSCOPIC
BILIRUBIN URINE: NEGATIVE
Glucose, UA: NEGATIVE mg/dL
Ketones, ur: NEGATIVE mg/dL
Leukocytes, UA: NEGATIVE
Nitrite: NEGATIVE
PROTEIN: NEGATIVE mg/dL
Specific Gravity, Urine: 1.024 (ref 1.005–1.030)
pH: 6 (ref 5.0–8.0)

## 2017-09-07 LAB — BASIC METABOLIC PANEL
Anion gap: 7 (ref 5–15)
BUN: 19 mg/dL (ref 6–20)
CALCIUM: 9.3 mg/dL (ref 8.9–10.3)
CHLORIDE: 104 mmol/L (ref 101–111)
CO2: 25 mmol/L (ref 22–32)
CREATININE: 0.7 mg/dL (ref 0.44–1.00)
GFR calc non Af Amer: >60 mL/min (ref >60)
Glucose, Bld: 89 mg/dL (ref 65–99)
Potassium: 3.6 mmol/L (ref 3.5–5.1)
Sodium: 136 mmol/L (ref 135–145)

## 2017-09-07 LAB — HCG, QUANTITATIVE, PREGNANCY: hCG, Beta Chain, Quant, S: 13167 m[IU]/mL — ABNORMAL HIGH (ref <5)

## 2017-09-07 LAB — ABO/RH: ABO/RH(D): A POS

## 2017-09-07 NOTE — ED Notes (Signed)
US at bedside

## 2017-09-07 NOTE — ED Provider Notes (Signed)
[facility]-EMERGENCY DEPT Provider Note   CSN: [id number] Arrival date & time: 09/06/17  2151     History   Chief Complaint Chief Complaint  Patient presents with  . Vaginal Bleeding    HPI Christine Rose is a 27 y.o. female.  Patient presents to the emergency department with a chief complaint of vaginal bleeding.  She states that she is [redacted] weeks pregnant.  She states that she noticed some spotting a day or so ago.  She is now having some bilateral cramping.  She does not have an OB/GYN.  She denies any other associated symptoms.  This is her first pregnancy.  She denies any trauma.   The history is provided by the patient. No language interpreter was used.    Past Medical History:  Diagnosis Date  . Allergic rhinitis   . ASCUS (atypical squamous cells of undetermined significance) on Pap smear 01/08/2012   hpv pos,follow up pap 09-21-12 Ascus, pap 05-20-14 Neg  . Chlamydia 2010 and 10/06/15  . Female infertility   . History of chicken pox   . Osgood-Schlatter's disease    hx  . Screening for chlamydial disease 09/2008   pos test  . Seasonal allergies   . 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 Vaginal Pap smear, abnormal     Patient Active Problem List   Diagnosis Date Noted  . Right upper quadrant pain 05/20/2014  . Acute right lower quadrant pain 05/20/2014  . Other specified fever 05/20/2014  . Abnormal Pap smear 07/21/2012  . ASCUS (atypical squamous cells of undetermined significance) on Pap smear 01/08/2012  . Routine gynecological examination 12/27/2011  . Surveillance for medroxyprogesterone contraception 12/27/2011  . Contraceptive management 03/04/2011  . Encounter for surveillance of injectable contraceptive 03/04/2011  . Allergic rhinitis   . Screening for STD (sexually transmitted disease) 12/26/2010  . Preventative health care 12/26/2010  . CHEST PAIN, ACUTE 05/25/2010  . ALLERGIC RHINITIS 12/23/2009  . GERD  12/23/2009  . HAND PAIN, LEFT 02/28/2009  . BACK PAIN 06/22/2008  . UNSPECIFIED DISORDER OF SKIN&SUBCUTANEOUS TISSUE 06/29/2007  . EXCESSIVE MENSTRUATION 06/04/2007    Past Surgical History:  Procedure Laterality Date  . NO PAST SURGERIES      OB History    Gravida Para Term Preterm AB Living   0 0 0 0 0 0   SAB TAB Ectopic Multiple Live Births   0 0 0 0         Home Medications    Prior to Admission medications   Medication Sig Start Date End Date Taking? Authorizing Provider  azithromycin (ZITHROMAX) 1 g powder Take 1 packet by mouth once. 10/12/15   Patton Salles, MD  diazepam (VALIUM) 2 MG tablet Take 1 tablet (2 mg total) by mouth every 8 (eight) hours as needed for muscle spasms. 03/04/17   Irean Hong, MD  doxycycline (VIBRAMYCIN) 100 MG capsule Take one capsule (100 mg) by mouth twice a day for five days, starting on the day of the hysterosalpingogram test. 10/08/15   Patton Salles, MD  ibuprofen (ADVIL,MOTRIN) 600 MG tablet Take 1 tablet (600 mg total) by mouth every 8 (eight) hours as needed. 03/04/17   Irean Hong, MD  metroNIDAZOLE (FLAGYL) 500 MG tablet Take 1 tablet (500 mg total) by mouth 2 (two) times daily. 10/12/15   Patton Salles, MD    Family History Family History  Problem Relation  Age of Onset  . Other Mother        large ASD declining surgical intervention  . Stroke Mother 30  . Breast cancer Maternal Grandmother 15  . Diabetes Maternal Grandmother   . Hypertension Maternal Grandmother   . Diabetes Maternal Grandfather   . Hypertension Maternal Grandfather   . Diabetes Paternal Grandmother   . Hypertension Paternal Grandmother   . Diabetes Paternal Grandfather   . Hypertension Paternal Grandfather     Social History Social History   Tobacco Use  . Smoking status: Never Smoker  . Smokeless tobacco: Never Used  Substance Use Topics  . Alcohol use: No    Alcohol/week: 0.0 oz  . Drug use: No     Allergies    Patient has no known allergies.   Review of Systems Review of Systems  All other systems reviewed and are negative.    Physical Exam Updated Vital Signs BP 119/76 (BP Location: Left Arm)   Pulse 64   Temp 98.6 F (37 C) (Oral)   Resp 18   Ht 5\' 4"  (1.626 m)   Wt 65.3 kg (144 lb)   SpO2 100%   BMI 24.72 kg/m   Physical Exam  Constitutional: She is oriented to person, place, and time. She appears well-developed and well-nourished.  HENT:  Head: Normocephalic and atraumatic.  Eyes: Conjunctivae and EOM are normal. Pupils are equal, round, and reactive to light.  Neck: Normal range of motion. Neck supple.  Cardiovascular: Normal rate and regular rhythm. Exam reveals no gallop and no friction rub.  No murmur heard. Pulmonary/Chest: Effort normal and breath sounds normal. No respiratory distress. She has no wheezes. She has no rales. She exhibits no tenderness.  Abdominal: Soft. Bowel sounds are normal. She exhibits no distension and no mass. There is no tenderness. There is no rebound and no guarding.  Genitourinary:  Genitourinary Comments: Chaperone present for speculum exam, there is a small amount of dark blood in the vaginal canal  Musculoskeletal: Normal range of motion. She exhibits no edema or tenderness.  Neurological: She is alert and oriented to person, place, and time.  Skin: Skin is warm and dry.  Psychiatric: She has a normal mood and affect. Her behavior is normal. Judgment and thought content normal.  Nursing note and vitals reviewed.    ED Treatments / Results  Labs (all labs ordered are listed, but only abnormal results are displayed) Labs Reviewed  URINALYSIS, ROUTINE W REFLEX MICROSCOPIC - Abnormal; Notable for the following components:      Result Value   Hgb urine dipstick SMALL (*)    Bacteria, UA RARE (*)    Squamous Epithelial / LPF 0-5 (*)    All other components within normal limits  CBC WITH DIFFERENTIAL/PLATELET  BASIC METABOLIC PANEL    HCG, QUANTITATIVE, PREGNANCY  ABO/RH    EKG  EKG Interpretation None       Radiology US Ob Comp < 14 Wks  Result Date: 09/07/2017 CLINICAL DATA:  Spotting for 2 days EXAM: OBSTETRIC <14 WK [place] AND TRANSVAGINAL OB US TECHNIQUE: Both transabdominal and transvaginal ultrasound examinations were performed for complete evaluation of the gestation as well as the maternal uterus, adnexal regions, and pelvic cul-de-sac. Transvaginal technique was performed to assess early pregnancy. COMPARISON:  None. FINDINGS: Intrauterine gestational sac: Visible, irregular in shape with debris and possible septations. Yolk sac:  Not seen Embryo:  Visible Cardiac Activity: Not seen CRL:  3.7 mm   6 w   0  d                  US EDC: 05/03/2018 Subchorionic hemorrhage:  None visualized. Maternal uterus/adnexae: Bilateral ovaries are within normal limits. The right ovary measures 3.1 x 1.8 x 1.6 cm. The left ovary measures 3.1 x 2.1 x 2.4 cm. Trace free fluid. IMPRESSION: 1. Single intrauterine pregnancy but with irregular shape gestational sac containing debris and septa. Embryo is visualized with average crown-rump length of 3.7 mm, however no fetal cardiac activity is demonstrated. Findings are suspicious but not yet definitive for failed pregnancy. Recommend follow-up US in 10-14 days for definitive diagnosis. This recommendation follows SRU consensus guidelines: Diagnostic Criteria for Nonviable Pregnancy Early in the First Trimester. Malva Limes Engl J Med 2013; 657:8469-62; 369:1443-51. 2. Trace free fluid in the pelvis Electronically Signed   By: Jasmine PangKim  Fujinaga M.D.   On: 09/07/2017 02:05   [place] Ob Transvaginal  Result Date: 09/07/2017 CLINICAL DATA:  Spotting for 2 days EXAM: OBSTETRIC <14 WK US AND TRANSVAGINAL OB US TECHNIQUE: Both transabdominal and transvaginal ultrasound examinations were performed for complete evaluation of the gestation as well as the maternal uterus, adnexal regions, and pelvic cul-de-sac. Transvaginal technique was  performed to assess early pregnancy. COMPARISON:  None. FINDINGS: Intrauterine gestational sac: Visible, irregular in shape with debris and possible septations. Yolk sac:  Not seen Embryo:  Visible Cardiac Activity: Not seen CRL:  3.7 mm   6 w   0 d                  US EDC: 05/03/2018 Subchorionic hemorrhage:  None visualized. Maternal uterus/adnexae: Bilateral ovaries are within normal limits. The right ovary measures 3.1 x 1.8 x 1.6 cm. The left ovary measures 3.1 x 2.1 x 2.4 cm. Trace free fluid. IMPRESSION: 1. Single intrauterine pregnancy but with irregular shape gestational sac containing debris and septa. Embryo is visualized with average crown-rump length of 3.7 mm, however no fetal cardiac activity is demonstrated. Findings are suspicious but not yet definitive for failed pregnancy. Recommend follow-up US in 10-14 days for definitive diagnosis. This recommendation follows SRU consensus guidelines: Diagnostic Criteria for Nonviable Pregnancy Early in the First Trimester. Malva Limes Engl J Med 2013; 952:8413-24; 369:1443-51. 2. Trace free fluid in the pelvis Electronically Signed   By: Jasmine PangKim  Fujinaga M.D.   On: 09/07/2017 02:05    Procedures Procedures (including critical care time)  Medications Ordered in ED Medications - No data to display   Initial Impression / Assessment and Plan / ED Course  I have reviewed the triage vital signs and the nursing notes.  Pertinent labs & imaging results that were available during my care of the patient were reviewed by me and considered in my medical decision making (see chart for details).    Patient who is [redacted] weeks pregnant with vaginal bleeding and lower abdominal cramping.  Will check labs, hCG, Rh, and ultrasound.  Ultrasound is remarkable for single IUP, but with irregular shaped gestational sac containing debris and septa.  This is concerning for failed pregnancy.  [facility].  Rh+, no indication for RhoGam.  Vital signs are  stable.  Patient is well-appearing.  I discussed the plan and the results with the patient.  She understands agrees with the plan.  Final Clinical Impressions(s) / ED Diagnoses   Final diagnoses:  Threatened miscarriage    ED Discharge Orders    None       Roxy HorsemanBrowning, Jewelianna Pancoast, PA-C 09/07/17 [phone]  Gilda Crease, MD 09/07/17 [phone]

## 2017-09-07 NOTE — ED Notes (Signed)
Unable to locate fetal heart tones at this time. Pt estimates she is around 10 weeks.  RN and ED PA aware.

## 2017-09-07 NOTE — ED Notes (Signed)
AVS explained in detail. [facility]. Given work note. Ambulatory upon discharge. No other c/c. Knows to only take tylenol while pregnant. Also knows to call medical records on Monday for US results/video.

## 2017-09-09 ENCOUNTER — Other Ambulatory Visit: Payer: Self-pay | Admitting: Advanced Practice Midwife

## 2017-09-09 DIAGNOSIS — O2621 Pregnancy care for patient with recurrent pregnancy loss, first trimester: Secondary | ICD-10-CM

## 2017-09-09 DIAGNOSIS — O209 Hemorrhage in early pregnancy, unspecified: Secondary | ICD-10-CM

## 2017-09-09 DIAGNOSIS — O3680X0 Pregnancy with inconclusive fetal viability, not applicable or unspecified: Secondary | ICD-10-CM

## 2017-09-09 DIAGNOSIS — Z3A01 Less than 8 weeks gestation of pregnancy: Secondary | ICD-10-CM

## 2017-09-09 NOTE — Progress Notes (Signed)
[facility]. Does not know what she needs to have done. On review of provider note from ED 09/07/17 she a US showing 6 week IUP, no FHR. Still having scant spotting and mild pain the same as at ED visit. Discussed that bloodwork is not indicated due to previously confirmed IUP, but viability has not been confirmed.Will schedule OP US in ~10 days. SAB precautions. Reviewed.

## 2017-09-10 ENCOUNTER — Inpatient Hospital Stay (HOSPITAL_COMMUNITY)
Admission: AD | Admit: 2017-09-10 | Discharge: 2017-09-10 | Disposition: A | Discharge status: Home / Self Care | Payer: Medicaid Other | Source: Ambulatory Visit | Attending: Obstetrics & Gynecology | Admitting: Obstetrics & Gynecology

## 2017-09-10 ENCOUNTER — Inpatient Hospital Stay (HOSPITAL_COMMUNITY): Payer: Medicaid Other

## 2017-09-10 ENCOUNTER — Encounter: Payer: Self-pay | Admitting: Student

## 2017-09-10 ENCOUNTER — Other Ambulatory Visit: Payer: Self-pay

## 2017-09-10 DIAGNOSIS — Z3A01 Less than 8 weeks gestation of pregnancy: Secondary | ICD-10-CM | POA: Diagnosis not present

## 2017-09-10 DIAGNOSIS — O209 Hemorrhage in early pregnancy, unspecified: Secondary | ICD-10-CM

## 2017-09-10 DIAGNOSIS — O039 Complete or unspecified spontaneous abortion without complication: Secondary | ICD-10-CM | POA: Insufficient documentation

## 2017-09-10 LAB — URINALYSIS, ROUTINE W REFLEX MICROSCOPIC
Bacteria, UA: NONE SEEN
Bilirubin Urine: NEGATIVE
GLUCOSE, UA: NEGATIVE mg/dL
Ketones, ur: NEGATIVE mg/dL
Leukocytes, UA: NEGATIVE
Nitrite: NEGATIVE
PROTEIN: NEGATIVE mg/dL
Specific Gravity, Urine: 1.004 — ABNORMAL LOW (ref 1.005–1.030)
pH: 8 (ref 5.0–8.0)

## 2017-09-10 LAB — CBC
HEMATOCRIT: 37.3 % (ref 36.0–46.0)
Hemoglobin: 12.2 g/dL (ref 12.0–15.0)
MCH: 28 pg (ref 26.0–34.0)
MCHC: 32.7 g/dL (ref 30.0–36.0)
MCV: 85.6 fL (ref 78.0–100.0)
Platelets: 294 10*3/uL (ref 150–400)
RBC: 4.36 MIL/uL (ref 3.87–5.11)
RDW: 13.6 % (ref 11.5–15.5)
WBC: 7.7 10*3/uL (ref 4.0–10.5)

## 2017-09-10 NOTE — Discharge Instructions (Signed)

## 2017-09-10 NOTE — MAU Note (Signed)
Pt states she started bleeding an hour ago and she has seen some blots in the blood. She states she sees the blood when wiping.   She states she is having lower abdominal pain rating 10/10 intermittent.

## 2017-09-10 NOTE — MAU Provider Note (Signed)
History     CSN: [id number]  Arrival date and time: 09/10/17 2047   First Provider Initiated Contact with Patient 09/10/17 2142      Chief Complaint  Patient presents with  . Vaginal Bleeding   HPI  Christine Rose is a 27 y.o. G1P0000 at [redacted]w[redacted]d who presents with vaginal bleeding and abdominal pain. Was seen in ED the other day for vaginal spotting. Has IUP with irregular shaped sac, embryo without heart beat. Scheduled to f/u next week. States bleeding and pain increased this evening. Reports bleeding like a period and passed several small clots. Lower abdominal cramping that she rates 1/010. Has not treated pain. Nothing makes pain better; moving makes pain worse.   OB History    Gravida Para Term Preterm AB Living   1 0 0 0 0 0   SAB TAB Ectopic Multiple Live Births   0 0 0 0        Past Medical History:  Diagnosis Date  . Allergic rhinitis   . ASCUS (atypical squamous cells of undetermined significance) on Pap smear 01/08/2012   hpv pos,follow up pap 09-21-12 Ascus, pap 05-20-14 Neg  . Chlamydia 2010 and 10/06/15  . Female infertility   . History of chicken pox   . Osgood-Schlatter's disease    hx  . Screening for chlamydial disease 09/2008   pos test  . Seasonal allergies   . 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 Vaginal Pap smear, abnormal     Past Surgical History:  Procedure Laterality Date  . NO PAST SURGERIES      Family History  Problem Relation Age of Onset  . Other Mother        large ASD declining surgical intervention  . Stroke Mother 64  . Breast cancer Maternal Grandmother 71  . Diabetes Maternal Grandmother   . Hypertension Maternal Grandmother   . Diabetes Maternal Grandfather   . Hypertension Maternal Grandfather   . Diabetes Paternal Grandmother   . Hypertension Paternal Grandmother   . Diabetes Paternal Grandfather   . Hypertension Paternal Grandfather     Social History   Tobacco Use  . Smoking status: Never Smoker   . Smokeless tobacco: Never Used  Substance Use Topics  . Alcohol use: No    Alcohol/week: 0.0 oz  . Drug use: No    Allergies: No Known Allergies  Medications Prior to Admission  Medication Sig Dispense Refill Last Dose  . azithromycin (ZITHROMAX) 1 g powder Take 1 packet by mouth once. 1 packet 0   . diazepam (VALIUM) 2 MG tablet Take 1 tablet (2 mg total) by mouth every 8 (eight) hours as needed for muscle spasms. 15 tablet 0   . doxycycline (VIBRAMYCIN) 100 MG capsule Take one capsule (100 mg) by mouth twice a day for five days, starting on the day of the hysterosalpingogram test. 10 capsule 0   . ibuprofen (ADVIL,MOTRIN) 600 MG tablet Take 1 tablet (600 mg total) by mouth every 8 (eight) hours as needed. 15 tablet 0   . metroNIDAZOLE (FLAGYL) 500 MG tablet Take 1 tablet (500 mg total) by mouth 2 (two) times daily. 14 tablet 0     Review of Systems  Constitutional: Negative.   Gastrointestinal: Positive for abdominal pain. Negative for nausea and vomiting.  Genitourinary: Positive for vaginal bleeding. Negative for dysuria.  Musculoskeletal: Negative for back pain.   Physical Exam   Blood pressure 119/78, pulse (!) 103, temperature 98 F (36.7  C), temperature source Oral, resp. rate 17, weight 137 lb 4 oz (62.3 kg), last menstrual period 02/10/2017, SpO2 100 %.  Physical Exam  Nursing note and vitals reviewed. Constitutional: She is oriented to person, place, and time. She appears well-developed and well-nourished. No distress.  HENT:  Head: Normocephalic and atraumatic.  Eyes: Conjunctivae are normal. Right eye exhibits no discharge. Left eye exhibits no discharge. No scleral icterus.  Neck: Normal range of motion.  Respiratory: Effort normal. No respiratory distress.  GI: Soft. There is no tenderness.  Genitourinary: There is bleeding in the vagina.  Genitourinary Comments: Small amount of dark red blood. ~4 cm of gray tissue removed from os. Bleeding decreased.    Neurological: She is alert and oriented to person, place, and time.  Skin: Skin is warm and dry. She is not diaphoretic.  Psychiatric: She has a normal mood and affect. Her behavior is normal. Judgment and thought content normal.    MAU Course  Procedures Results for orders placed or performed during the hospital encounter of 09/10/17 (from the past 24 hour(s))  Urinalysis, Routine w reflex microscopic     Status: Abnormal   Collection Time: 09/10/17  8:53 PM  Result Value Ref Range   Color, Urine STRAW (A) YELLOW   APPearance CLEAR CLEAR   Specific Gravity, Urine 1.004 (L) 1.005 - 1.030   pH 8.0 5.0 - 8.0   Glucose, UA NEGATIVE NEGATIVE mg/dL   Hgb urine dipstick MODERATE (A) NEGATIVE   Bilirubin Urine NEGATIVE NEGATIVE   Ketones, ur NEGATIVE NEGATIVE mg/dL   Protein, ur NEGATIVE NEGATIVE mg/dL   Nitrite NEGATIVE NEGATIVE   Leukocytes, UA NEGATIVE NEGATIVE   RBC / HPF 0-5 0 - 5 RBC/hpf   WBC, UA 0-5 0 - 5 WBC/hpf   Bacteria, UA NONE SEEN NONE SEEN   Squamous Epithelial / LPF 0-5 (A) NONE SEEN  CBC     Status: None   Collection Time: 09/10/17  9:49 PM  Result Value Ref Range   WBC 7.7 4.0 - 10.5 K/uL   RBC 4.36 3.87 - 5.11 MIL/uL   Hemoglobin 12.2 12.0 - 15.0 g/dL   HCT 16.1 09.6 - 04.5 %   MCV 85.6 78.0 - 100.0 fL   MCH 28.0 26.0 - 34.0 pg   MCHC 32.7 30.0 - 36.0 g/dL   RDW 40.9 81.1 - 91.4 %   Platelets 294 150 - 400 K/uL   US Ob Transvaginal  Result Date: 09/10/2017 CLINICAL DATA:  Bleeding, lower abdominal pain EXAM: TRANSVAGINAL OB ULTRASOUND TECHNIQUE: Transvaginal ultrasound was performed for complete evaluation of the gestation as well as the maternal uterus, adnexal regions, and pelvic cul-de-sac. COMPARISON:  09/07/2017 FINDINGS: Intrauterine gestational sac: None visualized Yolk sac:  Not visualized Embryo:  Not visualized Cardiac Activity: Chest Heart Rate:  bpm MSD:   mm    w     d CRL:     mm    w  d                  [place] EDC: Subchorionic hemorrhage:  N/A  Maternal uterus/adnexae: No adnexal mass or free fluid. No endometrial thickening or hypervascularity to suggest retained products of conception. IMPRESSION: Previously seen intrauterine gestation no longer visualized compatible with spontaneous abortion. No visible retained products of conception. Electronically Signed   By: Charlett Nose M.D.   On: 09/10/2017 22:39    MDM CBC A positive Tissue removed during pelvic exam -- patient reports improvement in symptoms  Ultrasound shows previously seen IUP no longer present --- complete miscarriage  Assessment and Plan  A: 1. Miscarriage   2. Vaginal bleeding in pregnancy, first trimester    P: Discharge home Pelvic rest SAB f/u in 2 weeks -- msg sent to [facility] Discussed reasons to return to MAU  Judeth HornErin Adeliz Tonkinson 09/10/2017, 9:42 PM

## 2017-09-11 ENCOUNTER — Telehealth: Payer: Self-pay | Admitting: General Practice

## 2017-09-11 NOTE — Telephone Encounter (Signed)
Called and notified patient of follow up SAB appointment on 09/25/17 at 9:55am.  Patient voiced understanding.

## 2017-09-19 ENCOUNTER — Ambulatory Visit (HOSPITAL_COMMUNITY): Admission: RE | Admit: 2017-09-19 | Payer: Medicaid Other | Source: Ambulatory Visit

## 2017-09-25 ENCOUNTER — Ambulatory Visit: Payer: Medicaid Other | Admitting: Student

## 2017-09-25 ENCOUNTER — Encounter: Payer: Self-pay | Admitting: Student

## 2017-09-25 VITALS — BP 105/72 | HR 60 | Wt 134.6 lb

## 2017-09-25 DIAGNOSIS — Z5189 Encounter for other specified aftercare: Secondary | ICD-10-CM

## 2017-09-25 DIAGNOSIS — O039 Complete or unspecified spontaneous abortion without complication: Secondary | ICD-10-CM | POA: Diagnosis not present

## 2017-09-25 NOTE — Patient Instructions (Signed)
Natural Family Planning Introduction Natural Family Planning (NFP) is a type of birth control without using any form of contraception. Women who use NFP should not have sexual intercourse when the ovary produces an egg (ovulation) during the menstrual cycle. The NFP method is safe and can prevent pregnancy. It is 75% effective when practiced right. The man needs to also understand this method of birth control and the woman needs to be aware of how her body functions during her menstrual cycle. NFP can also be used as a method of getting pregnant. HOW THE NFP METHOD WORKS  A woman's menstrual period usually happens every 28-30 days (it can vary from 23-35 days).  Ovulation happens 12-14 days before the start of the next menstrual period (the fertile period). The egg is fertile for 24 hours and the sperm can live for 3 days or more. If there is sexual intercourse at this time, pregnancy can occur. THERE ARE MANY TYPES OF NFP METHODS USED TO PREVENT PREGNANCY  The basal body temperature method. Often times, there is a slight increase of body temperature when a woman ovulates. Take your temperature every morning before getting out of bed. Write the temperature on a chart. An increase in the temperature shows ovulation has happened. Do not have sexual intercourse from the menstrual period up to three days after the increase in the temperature. Note that the body temperature may increase as a result of fever, restless sleep, and working schedules.  The ovulation cervical mucus method. During the menstrual cycle, the cervical mucus changes from dry and sticky to wet and slippery. Check the mucus of the vagina every day to look for these changes. Just before ovulation, the mucus becomes wet and slippery. On the last day of wetness, ovulation happens. To avoid getting pregnant, sexual intercourse is safe for about 10 days after the menstrual period and on the dry mucus days. Do not have sexual intercourse when  the mucus starts to show up and not until 4 days after the wet and slippery mucus goes away. Sexual intercourse after the 4 days have passed until the menstrual period starts is a safe time. Note that the mucus from the vagina can increase because of a vaginal or cervical infection, lubricants, some medicines, and sexual excitement.  The symptothermal method. This method uses both the temperature and the ovulation methods. Combine the two methods above to prevent pregnancy.  The calendar method. Record your menstrual periods and length of the cycles for 6 months. This is helpful when the menstrual cycle varies in the length of the cycle. The length of a menstrual cycle is from day 1 of the present menstrual period to day 1 of the next menstrual period. Then, find your fertile days of the month and do not have sexual intercourse during that time. You may need help from your health care provider to find out your fertile days. There are some signs of ovulation that may be helpful when trying to find the time of ovulation. This includes vaginal spotting or abdominal cramps during the middle of your menstrual cycle. Not all women have these symptoms. YOU SHOULD NOT USE NFP IF:  You have very irregular menstrual periods and may skip months.  You have abnormal bleeding.  You have a vaginal or cervical infection.  You are on medicines that can affect the vaginal mucus or body temperature. These medicines include antibiotics, thyroid medicines, and antihistamines (cold and allergy medicine). This information is not intended to replace advice given   to you by your health care provider. Make sure you discuss any questions you have with your health care provider. Document Released: 01/15/2008 Document Revised: 01/04/2016 Document Reviewed: 01/29/2013 Elsevier Interactive Patient Education  2017 Elsevier Inc.  

## 2017-09-25 NOTE — Progress Notes (Signed)
Pt stated denied pain or bleeding. Pt stated having right side of the neck since had the miscarriage.

## 2017-09-27 DIAGNOSIS — O039 Complete or unspecified spontaneous abortion without complication: Secondary | ICD-10-CM | POA: Insufficient documentation

## 2017-09-27 NOTE — Progress Notes (Signed)
   Subjective:    Patient ID: Christine Rose, female    DOB: [birth year], 27 y.o.   MRN: [id number]  HPI Patient Christine Rose is a 27 y.o. G1P0000 Here for follow up for miscarriage. Patient was seen in MAU on 1-20; US showed uterus with no products of conception. Patient denies bleeding or pain. Endorses some neck pain.  Patient coping well; she does not desire any contraception at this time.   Review of Systems  Constitutional: Negative.   HENT: Negative.   Respiratory: Negative.   Cardiovascular: Negative.   Gastrointestinal: Negative.   Genitourinary: Negative.   Musculoskeletal: Negative.   Neurological: Negative.   Hematological: Negative.        Objective:   Physical Exam  Constitutional: She is oriented to person, place, and time. She appears well-developed.  HENT:  Head: Normocephalic.  Eyes: Pupils are equal, round, and reactive to light.  Neck: Normal range of motion.  Pulmonary/Chest: Effort normal.  Abdominal: Soft.  Musculoskeletal: Normal range of motion.  Neurological: She is alert and oriented to person, place, and time.  Skin: Skin is warm and dry.  Psychiatric: She has a normal mood and affect.          Assessment & Plan:   1. Follow-up visit after miscarriage   2. Miscarriage    2. Encouraged patient to start tracking her periods again and to buy ovulation predictor kit if she wants to get pregnant right away.  3. Recommended continue taking prenatal vitamins and eat healthy, exercise.  4. Patient verbalized understanding.

## 2017-12-05 DIAGNOSIS — A549 Gonococcal infection, unspecified: Secondary | ICD-10-CM

## 2017-12-05 HISTORY — DX: Gonococcal infection, unspecified: A54.9

## 2017-12-07 ENCOUNTER — Inpatient Hospital Stay (HOSPITAL_COMMUNITY): Payer: 59

## 2017-12-07 ENCOUNTER — Inpatient Hospital Stay (HOSPITAL_COMMUNITY)
Admission: AD | Admit: 2017-12-07 | Discharge: 2017-12-07 | Disposition: A | Discharge status: Home / Self Care | Payer: 59 | Source: Ambulatory Visit | Attending: Obstetrics and Gynecology | Admitting: Obstetrics and Gynecology

## 2017-12-07 ENCOUNTER — Encounter (HOSPITAL_COMMUNITY): Payer: Self-pay

## 2017-12-07 DIAGNOSIS — A568 Sexually transmitted chlamydial infection of other sites: Secondary | ICD-10-CM | POA: Diagnosis not present

## 2017-12-07 DIAGNOSIS — Z3A08 8 weeks gestation of pregnancy: Secondary | ICD-10-CM | POA: Diagnosis not present

## 2017-12-07 DIAGNOSIS — O209 Hemorrhage in early pregnancy, unspecified: Secondary | ICD-10-CM | POA: Insufficient documentation

## 2017-12-07 DIAGNOSIS — R3129 Other microscopic hematuria: Secondary | ICD-10-CM | POA: Insufficient documentation

## 2017-12-07 DIAGNOSIS — O98811 Other maternal infectious and parasitic diseases complicating pregnancy, first trimester: Secondary | ICD-10-CM

## 2017-12-07 DIAGNOSIS — O4691 Antepartum hemorrhage, unspecified, first trimester: Secondary | ICD-10-CM

## 2017-12-07 DIAGNOSIS — A5402 Gonococcal vulvovaginitis, unspecified: Secondary | ICD-10-CM | POA: Insufficient documentation

## 2017-12-07 DIAGNOSIS — O4690 Antepartum hemorrhage, unspecified, unspecified trimester: Secondary | ICD-10-CM

## 2017-12-07 DIAGNOSIS — Z6790 Unspecified blood type, Rh positive: Secondary | ICD-10-CM

## 2017-12-07 DIAGNOSIS — O98211 Gonorrhea complicating pregnancy, first trimester: Secondary | ICD-10-CM | POA: Diagnosis not present

## 2017-12-07 DIAGNOSIS — O98311 Other infections with a predominantly sexual mode of transmission complicating pregnancy, first trimester: Secondary | ICD-10-CM | POA: Insufficient documentation

## 2017-12-07 DIAGNOSIS — A749 Chlamydial infection, unspecified: Secondary | ICD-10-CM

## 2017-12-07 DIAGNOSIS — Z3491 Encounter for supervision of normal pregnancy, unspecified, first trimester: Secondary | ICD-10-CM

## 2017-12-07 LAB — URINALYSIS, ROUTINE W REFLEX MICROSCOPIC
Bilirubin Urine: NEGATIVE
Glucose, UA: NEGATIVE mg/dL
Ketones, ur: NEGATIVE mg/dL
NITRITE: NEGATIVE
PH: 5 (ref 5.0–8.0)
Protein, ur: NEGATIVE mg/dL
RBC / HPF: >50 RBC/hpf — ABNORMAL HIGH (ref 0–5)
SPECIFIC GRAVITY, URINE: 1.027 (ref 1.005–1.030)
WBC, UA: >50 WBC/hpf — ABNORMAL HIGH (ref 0–5)

## 2017-12-07 MED ORDER — CEFTRIAXONE SODIUM 250 MG IJ SOLR
250.0000 mg | Freq: Once | INTRAMUSCULAR | Status: AC
  Administered 2017-12-07: 250 mg via INTRAMUSCULAR
  Filled 2017-12-07: qty 250

## 2017-12-07 MED ORDER — AZITHROMYCIN 250 MG PO TABS
1000.0000 mg | ORAL_TABLET | Freq: Once | ORAL | Status: AC
  Administered 2017-12-07: 1000 mg via ORAL
  Filled 2017-12-07: qty 4

## 2017-12-07 NOTE — MAU Provider Note (Signed)
History     CSN: [id number]  Arrival date and time: 12/07/17 4098   First Provider Initiated Contact with Patient 12/07/17 2006      Chief Complaint  Patient presents with  . Vaginal Bleeding   G1  here for treatment of STD. She had +GC and CT 2 days ago in the office and was told to come here for treatment. She reports spotting today. Only notices when she urinates and can see blood in the toiliet. She feels certain it is vaginal. No abd pain. No recent IC. She reports having an [place] in the office last week that showed and IUP w/cardiac activity. No record of this is on file.   OB History    Gravida  2   Para  0   Term  0   Preterm  0   AB  1   Living  0     SAB  1   TAB  0   Ectopic  0   Multiple  0   Live Births              Past Medical History:  Diagnosis Date  . Allergic rhinitis   . ASCUS (atypical squamous cells of undetermined significance) on Pap smear 01/08/2012   hpv pos,follow up pap 09-21-12 Ascus, pap 05-20-14 Neg  . Chlamydia 2010 and 10/06/15  . Female infertility   . History of chicken pox   . Osgood-Schlatter's disease    hx  . Screening for chlamydial disease 09/2008   pos test  . Seasonal allergies   . 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 Vaginal Pap smear, abnormal     Past Surgical History:  Procedure Laterality Date  . NO PAST SURGERIES      Family History  Problem Relation Age of Onset  . Other Mother        large ASD declining surgical intervention  . Stroke Mother 64  . Breast cancer Maternal Grandmother 34  . Diabetes Maternal Grandmother   . Hypertension Maternal Grandmother   . Diabetes Maternal Grandfather   . Hypertension Maternal Grandfather   . Diabetes Paternal Grandmother   . Hypertension Paternal Grandmother   . Diabetes Paternal Grandfather   . Hypertension Paternal Grandfather     Social History   Tobacco Use  . Smoking status: Never Smoker  . Smokeless tobacco: Never Used   Substance Use Topics  . Alcohol use: No    Alcohol/week: 0.0 oz  . Drug use: No    Allergies: No Known Allergies  No medications prior to admission.    Review of Systems  Gastrointestinal: Negative for abdominal pain.  Genitourinary: Positive for vaginal bleeding. Negative for vaginal discharge.   Physical Exam   Blood pressure 114/69, pulse 75, temperature 98.4 F (36.9 C), temperature source Oral, resp. rate 19, height  (1.626 m), weight 136 lb 8 oz (61.9 kg), last menstrual period 02/10/2017, SpO2 98 %, unknown if currently breastfeeding.  Physical Exam  Constitutional: She is oriented to person, place, and time. She appears well-developed and well-nourished. No distress.  HENT:  Head: Normocephalic and atraumatic.  Neck: Normal range of motion.  Cardiovascular: Normal rate.  Respiratory: Effort normal. No respiratory distress.  Genitourinary:  Genitourinary Comments: External: no lesions or erythema Vagina: rugated, pink, moist, mod thick creamy discharge, No blood or trace of blood Uterus: + enlarged, retroverted, non tender, no CMT Adnexae: no masses, no tenderness left, no tenderness right Cervix closed/long  Musculoskeletal: Normal range of motion.  Neurological: She is alert and oriented to person, place, and time.  Skin: Skin is warm and dry.  Psychiatric: She has a normal mood and affect.   Results for orders placed or performed during the hospital encounter of 12/07/17 (from the past 24 hour(s))  Urinalysis, Routine w reflex microscopic     Status: Abnormal   Collection Time: 12/07/17  7:45 PM  Result Value Ref Range   Color, Urine YELLOW YELLOW   APPearance HAZY (A) CLEAR   Specific Gravity, Urine 1.027 1.005 - 1.030   pH 5.0 5.0 - 8.0   Glucose, UA NEGATIVE NEGATIVE mg/dL   Hgb urine dipstick LARGE (A) NEGATIVE   Bilirubin Urine NEGATIVE NEGATIVE   Ketones, ur NEGATIVE NEGATIVE mg/dL   Protein, ur NEGATIVE NEGATIVE mg/dL   Nitrite NEGATIVE  NEGATIVE   Leukocytes, UA LARGE (A) NEGATIVE   RBC / HPF >50 (H) 0 - 5 RBC/hpf   WBC, UA >50 (H) 0 - 5 WBC/hpf   Bacteria, UA RARE (A) NONE SEEN   Squamous Epithelial / LPF 0-5 0 - 5   Mucus PRESENT    US Ob Less Than 14 Weeks With Ob Transvaginal  Result Date: 12/07/2017 CLINICAL DATA:  Bleeding with urination. Quantitative beta HCG was not obtained. Estimated gestational age by LMP is 8 weeks 0 days. EXAM: OBSTETRIC <14 WK [place] AND TRANSVAGINAL OB US TECHNIQUE: Both transabdominal and transvaginal ultrasound examinations were performed for complete evaluation of the gestation as well as the maternal uterus, adnexal regions, and pelvic cul-de-sac. Transvaginal technique was performed to assess early pregnancy. COMPARISON:  09/10/2017 FINDINGS: Intrauterine gestational sac: A single intrauterine gestational sac is present. Yolk sac:  Yolk sac is present. Embryo:  Fetal pole is identified. Cardiac Activity: Fetal cardiac activity is observed. Heart Rate: 164 bpm CRL: 16.6 mm   8 w   0 d                  [place] EDC: 07/19/2018 Subchorionic hemorrhage:  None visualized. Maternal uterus/adnexae: Uterus is anteverted. No myometrial mass lesions identified. Both ovaries are visualized and appear normal. Normal follicular changes. No abnormal adnexal masses. No free pelvic fluid collections. IMPRESSION: Single intrauterine pregnancy. Estimated gestational age by crown-rump length is 8 weeks 0 days. No acute complication demonstrated sonographically. Electronically Signed   By: Burman Nieves M.D.   On: 12/07/2017 21:16    MAU Course  Procedures Azithromycin Rocephin  MDM Labs and [place] ordered and reviewed. Hematuria noted. Will send for UC, pt denies urinary sx or pain. Normal viable IUP on [place], no SCH. Pt reassured. Treated for GC and CT, discussed partner needs to seek treatment and abstain from IC x3 weeks. Presentation, clinical findings, and plan discussed with Dr. Claiborne Billings. Stable for discharge  home.  Assessment and Plan   1. [redacted] weeks gestation of pregnancy   2. Vaginal bleeding in pregnancy   3. Normal intrauterine pregnancy on prenatal ultrasound in first trimester   4. Blood type, Rh positive   5. Other microscopic hematuria   6. Chlamydia infection affecting pregnancy in first trimester   7. Gonorrhea affecting pregnancy in first trimester    Discharge home Follow up in OB office as scheduled SAB/return precautions  Allergies as of 12/07/2017   No Known Allergies     Medication List    You have not been prescribed any medications.    Donette Larry, CNM 12/07/2017, 10:18 PM

## 2017-12-07 NOTE — MAU Note (Signed)
Vaginal bleeding that started today.  Only occurs when she pees.  No pain.  Had a miscarriage in January.  No other vaginal discharge.  Was told she had an abnormal pap smear.

## 2017-12-07 NOTE — Discharge Instructions (Signed)
Hematuria, Adult Hematuria is blood in your urine. It can be caused by a bladder infection, kidney infection, prostate infection, kidney stone, or cancer of your urinary tract. Infections can usually be treated with medicine, and a kidney stone usually will pass through your urine. If neither of these is the cause of your hematuria, further workup to find out the reason may be needed. It is very important that you tell your health care provider about any blood you see in your urine, even if the blood stops without treatment or happens without causing pain. Blood in your urine that happens and then stops and then happens again can be a symptom of a very serious condition. Also, pain is not a symptom in the initial stages of many urinary cancers. Follow these instructions at home:  Drink lots of fluid, 3-4 quarts a day. If you have been diagnosed with an infection, cranberry juice is especially recommended, in addition to large amounts of water.  Avoid caffeine, tea, and carbonated beverages because they tend to irritate the bladder.  Avoid alcohol because it may irritate the prostate.  Take all medicines as directed by your health care provider.  If you were prescribed an antibiotic medicine, finish it all even if you start to feel better.  If you have been diagnosed with a kidney stone, follow your health care provider's instructions regarding straining your urine to catch the stone.  Empty your bladder often. Avoid holding urine for long periods of time.  After a bowel movement, women should cleanse front to back. Use each tissue only once.  Empty your bladder before and after sexual intercourse if you are a female. Contact a health care provider if:  You develop back pain.  You have a fever.  You have a feeling of sickness in your stomach (nausea) or vomiting.  Your symptoms are not better in 3 days. Return sooner if you are getting worse. Get help right away if:  You develop  severe vomiting and are unable to keep the medicine down.  You develop severe back or abdominal pain despite taking your medicines.  You begin passing a large amount of blood or clots in your urine.  You feel extremely weak or faint, or you pass out. This information is not intended to replace advice given to you by your health care provider. Make sure you discuss any questions you have with your health care provider. Document Released: 07/29/2005 Document Revised: 01/04/2016 Document Reviewed: 03/29/2013 Elsevier Interactive Patient Education  2017 [employer] Vaginal Bleeding During Pregnancy, First Trimester A small amount of bleeding (spotting) from the vagina is common in early pregnancy. Sometimes the bleeding is normal and is not a problem, and sometimes it is a sign of something serious. Be sure to tell your doctor about any bleeding from your vagina right away. Follow these instructions at home:  Watch your condition for any changes.  Follow your doctor's instructions about how active you can be.  If you are on bed rest: ? You may need to stay in bed and only get up to use the bathroom. ? You may be allowed to do some activities. ? If you need help, make plans for someone to help you.  Write down: ? The number of pads you use each day. ? How often you change pads. ? How soaked (saturated) your pads are.  Do not use tampons.  Do not douche.  Do not have sex or orgasms until your doctor says it is okay.  If you pass any tissue from your vagina, save the tissue so you can show it to your doctor.  Only take medicines as told by your doctor.  Do not take aspirin because it can make you bleed.  Keep all follow-up visits as told by your doctor. Contact a doctor if:  You bleed from your vagina.  You have cramps.  You have labor pains.  You have a fever that does not go away after you take medicine. Get help right away if:  You have very bad cramps in your back  or belly (abdomen).  You pass large clots or tissue from your vagina.  You bleed more.  You feel light-headed or weak.  You pass out (faint).  You have chills.  You are leaking fluid or have a gush of fluid from your vagina.  You pass out while pooping (having a bowel movement). This information is not intended to replace advice given to you by your health care provider. Make sure you discuss any questions you have with your health care provider. Document Released: 12/13/2013 Document Revised: 01/04/2016 Document Reviewed: 04/05/2013 Elsevier Interactive Patient Education  [employer].

## 2017-12-09 LAB — CULTURE, OB URINE

## 2017-12-26 ENCOUNTER — Encounter (HOSPITAL_COMMUNITY): Payer: Self-pay

## 2017-12-26 ENCOUNTER — Other Ambulatory Visit: Payer: Self-pay

## 2017-12-26 ENCOUNTER — Inpatient Hospital Stay (HOSPITAL_COMMUNITY)
Admission: AD | Admit: 2017-12-26 | Discharge: 2017-12-26 | Disposition: A | Discharge status: Home / Self Care | Payer: 59 | Source: Ambulatory Visit | Attending: Obstetrics | Admitting: Obstetrics

## 2017-12-26 DIAGNOSIS — A084 Viral intestinal infection, unspecified: Secondary | ICD-10-CM | POA: Diagnosis not present

## 2017-12-26 DIAGNOSIS — O210 Mild hyperemesis gravidarum: Secondary | ICD-10-CM | POA: Insufficient documentation

## 2017-12-26 DIAGNOSIS — Z3A10 10 weeks gestation of pregnancy: Secondary | ICD-10-CM | POA: Diagnosis not present

## 2017-12-26 DIAGNOSIS — O99281 Endocrine, nutritional and metabolic diseases complicating pregnancy, first trimester: Secondary | ICD-10-CM | POA: Insufficient documentation

## 2017-12-26 DIAGNOSIS — E860 Dehydration: Secondary | ICD-10-CM | POA: Diagnosis not present

## 2017-12-26 DIAGNOSIS — R197 Diarrhea, unspecified: Secondary | ICD-10-CM | POA: Diagnosis present

## 2017-12-26 DIAGNOSIS — O26891 Other specified pregnancy related conditions, first trimester: Secondary | ICD-10-CM | POA: Diagnosis present

## 2017-12-26 LAB — COMPREHENSIVE METABOLIC PANEL
ALT: 32 U/L (ref 14–54)
AST: 20 U/L (ref 15–41)
Albumin: 3.8 g/dL (ref 3.5–5.0)
Alkaline Phosphatase: 48 U/L (ref 38–126)
Anion gap: 10 (ref 5–15)
BILIRUBIN TOTAL: 0.3 mg/dL (ref 0.3–1.2)
BUN: 8 mg/dL (ref 6–20)
CHLORIDE: 105 mmol/L (ref 101–111)
CO2: 20 mmol/L — ABNORMAL LOW (ref 22–32)
Calcium: 9.1 mg/dL (ref 8.9–10.3)
Creatinine, Ser: 0.67 mg/dL (ref 0.44–1.00)
GFR calc non Af Amer: >60 mL/min (ref >60)
Glucose, Bld: 83 mg/dL (ref 65–99)
POTASSIUM: 3.8 mmol/L (ref 3.5–5.1)
Sodium: 135 mmol/L (ref 135–145)
TOTAL PROTEIN: 8.1 g/dL (ref 6.5–8.1)

## 2017-12-26 LAB — CBC
HEMATOCRIT: 37.7 % (ref 36.0–46.0)
HEMOGLOBIN: 12.1 g/dL (ref 12.0–15.0)
MCH: 27.6 pg (ref 26.0–34.0)
MCHC: 32.1 g/dL (ref 30.0–36.0)
MCV: 86.1 fL (ref 78.0–100.0)
Platelets: 261 10*3/uL (ref 150–400)
RBC: 4.38 MIL/uL (ref 3.87–5.11)
RDW: 13.2 % (ref 11.5–15.5)
WBC: 5.4 10*3/uL (ref 4.0–10.5)

## 2017-12-26 LAB — URINALYSIS, ROUTINE W REFLEX MICROSCOPIC
Bilirubin Urine: NEGATIVE
Glucose, UA: NEGATIVE mg/dL
HGB URINE DIPSTICK: NEGATIVE
Ketones, ur: NEGATIVE mg/dL
LEUKOCYTES UA: NEGATIVE
Nitrite: NEGATIVE
Protein, ur: NEGATIVE mg/dL
SPECIFIC GRAVITY, URINE: 1.021 (ref 1.005–1.030)
pH: 7 (ref 5.0–8.0)

## 2017-12-26 MED ORDER — LACTATED RINGERS IV BOLUS
1000.0000 mL | Freq: Once | INTRAVENOUS | Status: AC
  Administered 2017-12-26: 1000 mL via INTRAVENOUS

## 2017-12-26 MED ORDER — ONDANSETRON 4 MG PO TBDP: 4.0000 mg | ORAL_TABLET | Freq: Three times a day (TID) | ORAL | 0 refills | Status: DC | PRN

## 2017-12-26 MED ORDER — DOXYLAMINE-PYRIDOXINE 10-10 MG PO TBEC: 2.0000 | DELAYED_RELEASE_TABLET | Freq: Every day | ORAL | 1 refills | Status: DC

## 2017-12-26 NOTE — Progress Notes (Addendum)
G2P0 @ 10.[redacted] wksga. Here dt darhea that started last night to this morning. 1 episode today. Denies LOF or bleeding.   Doppler [phone]  1300: Provider at bs assessing. Ordered for LR bolus and labs  1303: Labs at bs.

## 2017-12-26 NOTE — MAU Note (Signed)
Started last night, was up and down all night with diarrhea.  Having on going vomiting with preg.  Started as soft, formed, now watery

## 2017-12-26 NOTE — MAU Provider Note (Addendum)
History     CSN: [id number]  Arrival date and time: 12/26/17 1156   First Provider Initiated Contact with Patient 12/26/17 1248      Chief Complaint  Patient presents with  . Diarrhea  . Emesis   G2P0010 .5 weeks here with diarrhea. Sx started last night. She's had 4-5 episodes of watery stools. Did not see blood. No abd pain or VB. No sick contacts. Had not eaten food outside of home. No fevers. No N/V today but has had some almost daily over the last few weeks. She has tried OTC b6 and Unisom but could not keep down. She feels cold a lot, thinks she may be anemic.   OB History    Gravida  2   Para  0   Term  0   Preterm  0   AB  1   Living  0     SAB  1   TAB  0   Ectopic  0   Multiple  0   Live Births              Past Medical History:  Diagnosis Date  . Allergic rhinitis   . ASCUS (atypical squamous cells of undetermined significance) on Pap smear 01/08/2012   hpv pos,follow up pap 09-21-12 Ascus, pap 05-20-14 Neg  . Chlamydia 2010 and 10/06/15  . Female infertility   . History of chicken pox   . Osgood-Schlatter's disease    hx  . Screening for chlamydial disease 09/2008   pos test  . Seasonal allergies   . 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 Vaginal Pap smear, abnormal     Past Surgical History:  Procedure Laterality Date  . NO PAST SURGERIES      Family History  Problem Relation Age of Onset  . Other Mother        large ASD declining surgical intervention  . Stroke Mother 81  . Breast cancer Maternal Grandmother 29  . Diabetes Maternal Grandmother   . Hypertension Maternal Grandmother   . Diabetes Maternal Grandfather   . Hypertension Maternal Grandfather   . Diabetes Paternal Grandmother   . Hypertension Paternal Grandmother   . Diabetes Paternal Grandfather   . Hypertension Paternal Grandfather     Social History   Tobacco Use  . Smoking status: Never Smoker  . Smokeless tobacco: Never Used  Substance Use  Topics  . Alcohol use: No    Alcohol/week: 0.0 oz  . Drug use: No    Allergies: No Known Allergies  No medications prior to admission.    Review of Systems  Constitutional: Negative for chills and fever.  Gastrointestinal: Positive for diarrhea. Negative for abdominal pain, constipation, nausea and vomiting.  Genitourinary: Negative for dysuria, urgency and vaginal bleeding.   Physical Exam   Blood pressure 111/66, pulse 73, temperature 98.2 F (36.8 C), temperature source Oral, resp. rate 16, height  (1.626 m), weight 129 lb 12 oz (58.9 kg), last menstrual period 02/10/2017, SpO2 100 %, unknown if currently breastfeeding.  Physical Exam  Constitutional: She is oriented to person, place, and time. She appears well-developed and well-nourished. No distress.  HENT:  Head: Normocephalic and atraumatic.  Neck: Normal range of motion.  Cardiovascular: Normal rate.  Respiratory: Effort normal. No respiratory distress.  GI: Soft. She exhibits no distension and no mass. There is no tenderness. There is no rebound and no guarding.  Musculoskeletal: Normal range of motion.  Neurological: She is alert  and oriented to person, place, and time.  Skin: Skin is warm and dry.  Psychiatric: She has a normal mood and affect.  FHT 155  Results for orders placed or performed during the hospital encounter of 12/26/17 (from the past 24 hour(s))  Urinalysis, Routine w reflex microscopic     Status: None   Collection Time: 12/26/17 11:56 AM  Result Value Ref Range   Color, Urine YELLOW YELLOW   APPearance CLEAR CLEAR   Specific Gravity, Urine 1.021 1.005 - 1.030   pH 7.0 5.0 - 8.0   Glucose, UA NEGATIVE NEGATIVE mg/dL   Hgb urine dipstick NEGATIVE NEGATIVE   Bilirubin Urine NEGATIVE NEGATIVE   Ketones, ur NEGATIVE NEGATIVE mg/dL   Protein, ur NEGATIVE NEGATIVE mg/dL   Nitrite NEGATIVE NEGATIVE   Leukocytes, UA NEGATIVE NEGATIVE  CBC     Status: None   Collection Time: 12/26/17  1:00 PM   Result Value Ref Range   WBC 5.4 4.0 - 10.5 K/uL   RBC 4.38 3.87 - 5.11 MIL/uL   Hemoglobin 12.1 12.0 - 15.0 g/dL   HCT 13.0 86.5 - 78.4 %   MCV 86.1 78.0 - 100.0 fL   MCH 27.6 26.0 - 34.0 pg   MCHC 32.1 30.0 - 36.0 g/dL   RDW 69.6 29.5 - 28.4 %   Platelets 261 150 - 400 K/uL   MAU Course  Procedures LR  MDM Labs ordered and reviewed. Mild dehydration noted on UA. No N/V at this time, however has lost 7 lbs in last 2 weeks since last MAU visit. Will try regimen of Diclegis and Zofran. Presentation, clinical findings, and plan discussed with Dr. Chestine Spore. Stable for discharge home.  Assessment and Plan   1. [redacted] weeks gestation of pregnancy   2. Viral gastroenteritis   3. Dehydration   4. Morning sickness    Discharge home Rx Diclegis Rx Zofran Follow up in OB office as scheduled BRAT diet  Allergies as of 12/26/2017   No Known Allergies     Medication List    TAKE these medications   Doxylamine-Pyridoxine 10-10 MG Tbec Take 2 tablets by mouth at bedtime. May take 1 tab in am and 1 tab in afternoon if needed for nausea   ondansetron 4 MG disintegrating tablet Commonly known as:  ZOFRAN ODT Take 1 tablet (4 mg total) by mouth every 8 (eight) hours as needed for nausea or vomiting.      Donette Larry, CNM 12/26/2017, 1:29 PM

## 2017-12-26 NOTE — Discharge Instructions (Signed)
Viral Gastroenteritis, Adult Viral gastroenteritis is also known as the stomach flu. This condition is caused by certain germs (viruses). These germs can be passed from person to person very easily (are very contagious). This condition can cause sudden watery poop (diarrhea), fever, and throwing up (vomiting). Having watery poop and throwing up can make you feel weak and cause you to get dehydrated. Dehydration can make you tired and thirsty, make you have a dry mouth, and make it so you pee (urinate) less often. Older adults and people with other diseases or a weak defense system (immune system) are at higher risk for dehydration. It is important to replace the fluids that you lose from having watery poop and throwing up. Follow these instructions at home: Follow instructions from your doctor about how to care for yourself at home. Eating and drinking  Follow these instructions as told by your doctor:  Take an oral rehydration solution (ORS). This is a drink that is sold at pharmacies and stores.  Drink clear fluids in small amounts as you are able, such as: ? Water. ? Ice chips. ? Diluted fruit juice. ? Low-calorie sports drinks.  Eat bland, easy-to-digest foods in small amounts as you are able, such as: ? Bananas. ? Applesauce. ? Rice. ? Low-fat (lean) meats. ? Toast. ? Crackers.  Avoid fluids that have a lot of sugar or caffeine in them.  Avoid alcohol.  Avoid spicy or fatty foods.  General instructions  Drink enough fluid to keep your pee (urine) clear or pale yellow.  Wash your hands often. If you cannot use soap and water, use hand sanitizer.  Make sure that all people in your home wash their hands well and often.  Rest at home while you get better.  Take over-the-counter and prescription medicines only as told by your doctor.  Watch your condition for any changes.  Take a warm bath to help with any burning or pain from having watery poop.  Keep all follow-up  visits as told by your doctor. This is important. Contact a doctor if:  You cannot keep fluids down.  Your symptoms get worse.  You have new symptoms.  You feel light-headed or dizzy.  You have muscle cramps. Get help right away if:  You have chest pain.  You feel very weak or you pass out (faint).  You see blood in your throw-up.  Your throw-up looks like coffee grounds.  You have bloody or black poop (stools) or poop that look like tar.  You have a very bad headache, a stiff neck, or both.  You have a rash.  You have very bad pain, cramping, or bloating in your belly (abdomen).  You have trouble breathing.  You are breathing very quickly.  Your heart is beating very quickly.  Your skin feels cold and clammy.  You feel confused.  You have pain when you pee.  You have signs of dehydration, such as: ? Dark pee, hardly any pee, or no pee. ? Cracked lips. ? Dry mouth. ? Sunken eyes. ? Sleepiness. ? Weakness. This information is not intended to replace advice given to you by your health care provider. Make sure you discuss any questions you have with your health care provider. Document Released: 01/15/2008 Document Revised: 02/16/2016 Document Reviewed: 04/04/2015 Elsevier Interactive Patient Education  2017 Elsevier Inc. Morning Sickness Morning sickness is when you feel sick to your stomach (nauseous) during pregnancy. You may feel sick to your stomach and throw up (vomit). You may feel  sick in the morning, but you can feel this way any time of day. Some women feel very sick to their stomach and cannot stop throwing up (hyperemesis gravidarum). Follow these instructions at home:  Only take medicines as told by your doctor.  Take multivitamins as told by your doctor. Taking multivitamins before getting pregnant can stop or lessen the harshness of morning sickness.  Eat dry toast or unsalted crackers before getting out of bed.  Eat 5 to 6 small meals a  day.  Eat dry and bland foods like rice and baked potatoes.  Do not drink liquids with meals. Drink between meals.  Do not eat greasy, fatty, or spicy foods.  Have someone cook for you if the smell of food causes you to feel sick or throw up.  If you feel sick to your stomach after taking prenatal vitamins, take them at night or with a snack.  Eat protein when you need a snack (nuts, yogurt, cheese).  Eat unsweetened gelatins for dessert.  Wear a bracelet used for sea sickness (acupressure wristband).  Go to a doctor that puts thin needles into certain body points (acupuncture) to improve how you feel.  Do not smoke.  Use a humidifier to keep the air in your house free of odors.  Get lots of fresh air. Contact a doctor if:  You need medicine to feel better.  You feel dizzy or lightheaded.  You are losing weight. Get help right away if:  You feel very sick to your stomach and cannot stop throwing up.  You pass out (faint). This information is not intended to replace advice given to you by your health care provider. Make sure you discuss any questions you have with your health care provider. Document Released: 09/05/2004 Document Revised: 01/04/2016 Document Reviewed: 01/13/2013 Elsevier Interactive Patient Education  2017 Elsevier Inc. [place] Diet A bland diet consists of foods that do not have a lot of fat or fiber. Foods without fat or fiber are easier for the body to digest. They are also less likely to irritate your mouth, throat, stomach, and other parts of your gastrointestinal tract. A bland diet is sometimes called a BRAT diet. What is my plan? Your health care provider or dietitian may recommend specific changes to your diet to prevent and treat your symptoms, such as:  Eating small meals often.  Cooking food until it is soft enough to chew easily.  Chewing your food well.  Drinking fluids slowly.  Not eating foods that are very spicy, sour, or  fatty.  Not eating citrus fruits, such as oranges and grapefruit.  What do I need to know about this diet?  Eat a variety of foods from the bland diet food list.  Do not follow a bland diet longer than you have to.  Ask your health care provider whether you should take vitamins. What foods can I eat? Grains  Hot cereals, such as cream of wheat. Bread, crackers, or tortillas made from refined white flour. Rice. Vegetables Canned or cooked vegetables. Mashed or boiled potatoes. Fruits Bananas. Applesauce. Other types of cooked or canned fruit with the skin and seeds removed, such as canned peaches or pears. Meats and Other Protein Sources Scrambled eggs. Creamy peanut butter or other nut butters. Lean, well-cooked meats, such as chicken or fish. Tofu. Soups or broths. Dairy Low-fat dairy products, such as milk, cottage cheese, or yogurt. Beverages Water. Herbal tea. Apple juice. Sweets and Desserts Pudding. Custard. Fruit gelatin. Ice cream. Fats and  Oils Mild salad dressings. Canola or olive oil. The items listed above may not be a complete list of allowed foods or beverages. Contact your dietitian for more options. What foods are not recommended? Foods and ingredients that are often not recommended include:  Spicy foods, such as hot sauce or salsa.  Fried foods.  Sour foods, such as pickled or fermented foods.  Raw vegetables or fruits, especially citrus or berries.  Caffeinated drinks.  Alcohol.  Strongly flavored seasonings or condiments.  The items listed above may not be a complete list of foods and beverages that are not allowed. Contact your dietitian for more information. This information is not intended to replace advice given to you by your health care provider. Make sure you discuss any questions you have with your health care provider. Document Released: 11/20/2015 Document Revised: 01/04/2016 Document Reviewed: 08/10/2014 Elsevier Interactive Patient  Education  2018 [employer].

## 2017-12-30 ENCOUNTER — Other Ambulatory Visit: Payer: Self-pay

## 2017-12-30 ENCOUNTER — Inpatient Hospital Stay (HOSPITAL_COMMUNITY)
Admission: AD | Admit: 2017-12-30 | Discharge: 2017-12-30 | Disposition: A | Discharge status: Home / Self Care | Payer: 59 | Source: Ambulatory Visit | Attending: Obstetrics and Gynecology | Admitting: Obstetrics and Gynecology

## 2017-12-30 ENCOUNTER — Inpatient Hospital Stay (HOSPITAL_COMMUNITY): Payer: 59

## 2017-12-30 ENCOUNTER — Encounter (HOSPITAL_COMMUNITY): Payer: Self-pay

## 2017-12-30 DIAGNOSIS — Z3A11 11 weeks gestation of pregnancy: Secondary | ICD-10-CM | POA: Diagnosis not present

## 2017-12-30 DIAGNOSIS — O209 Hemorrhage in early pregnancy, unspecified: Secondary | ICD-10-CM | POA: Diagnosis present

## 2017-12-30 DIAGNOSIS — N939 Abnormal uterine and vaginal bleeding, unspecified: Secondary | ICD-10-CM

## 2017-12-30 DIAGNOSIS — O208 Other hemorrhage in early pregnancy: Secondary | ICD-10-CM

## 2017-12-30 LAB — WET PREP, GENITAL
Sperm: NONE SEEN
TRICH WET PREP: NONE SEEN
YEAST WET PREP: NONE SEEN

## 2017-12-30 NOTE — Discharge Instructions (Signed)
Vaginal Bleeding During Pregnancy, First Trimester °A small amount of bleeding (spotting) from the vagina is common in early pregnancy. Sometimes the bleeding is normal and is not a problem, and sometimes it is a sign of something serious. Be sure to tell your doctor about any bleeding from your vagina right away. °Follow these instructions at home: °· Watch your condition for any changes. °· Follow your doctor's instructions about how active you can be. °· If you are on bed rest: °? You may need to stay in bed and only get up to use the bathroom. °? You may be allowed to do some activities. °? If you need help, make plans for someone to help you. °· Write down: °? The number of pads you use each day. °? How often you change pads. °? How soaked (saturated) your pads are. °· Do not use tampons. °· Do not douche. °· Do not have sex or orgasms until your doctor says it is okay. °· If you pass any tissue from your vagina, save the tissue so you can show it to your doctor. °· Only take medicines as told by your doctor. °· Do not take aspirin because it can make you bleed. °· Keep all follow-up visits as told by your doctor. °Contact a doctor if: °· You bleed from your vagina. °· You have cramps. °· You have labor pains. °· You have a fever that does not go away after you take medicine. °Get help right away if: °· You have very bad cramps in your back or belly (abdomen). °· You pass large clots or tissue from your vagina. °· You bleed more. °· You feel light-headed or weak. °· You pass out (faint). °· You have chills. °· You are leaking fluid or have a gush of fluid from your vagina. °· You pass out while pooping (having a bowel movement). °This information is not intended to replace advice given to you by your health care provider. Make sure you discuss any questions you have with your health care provider. °Document Released: 12/13/2013 Document Revised: 01/04/2016 Document Reviewed: 04/05/2013 °Elsevier Interactive  Patient Education © 2018 Elsevier Inc. ° °

## 2017-12-30 NOTE — MAU Provider Note (Addendum)
Patient Christine Rose is a 27 y.o. G2P0010 At [redacted]w[redacted]d here with complaints of brown spotting since Sunday. She was seen in the ED on [place] with bright red bleeding. She has been seen in the MAU for multiple complaints this pregnancy; she is nervous since she had a miscarriage in January.  She denies dysuria, low back pain, NV at this time, abnormal discharge.  History     CSN: [id number]  Arrival date and time: 12/30/17 1655   None     No chief complaint on file.  Vaginal Bleeding  The patient's primary symptoms include vaginal bleeding. The patient's pertinent negatives include no genital itching, genital lesions or genital odor. This is a new problem. The current episode started in the past 7 days. The problem occurs intermittently. Pertinent negatives include no abdominal pain, constipation or diarrhea. The vaginal discharge was bloody. The vaginal bleeding is spotting. She has not been passing clots. She has not been passing tissue. Nothing aggravates the symptoms. She has tried nothing for the symptoms.    OB History    Gravida  2   Para  0   Term  0   Preterm  0   AB  1   Living  0     SAB  1   TAB  0   Ectopic  0   Multiple  0   Live Births              Past Medical History:  Diagnosis Date  . Allergic rhinitis   . ASCUS (atypical squamous cells of undetermined significance) on Pap smear 01/08/2012   hpv pos,follow up pap 09-21-12 Ascus, pap 05-20-14 Neg  . Chlamydia 2010 and 10/06/15  . Female infertility   . History of chicken pox   . Osgood-Schlatter's disease    hx  . Screening for chlamydial disease 09/2008   pos test  . Seasonal allergies   . 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 Vaginal Pap smear, abnormal     Past Surgical History:  Procedure Laterality Date  . NO PAST SURGERIES      Family History  Problem Relation Age of Onset  . Other Mother        large ASD declining surgical intervention  . Stroke Mother  55  . Breast cancer Maternal Grandmother 60  . Diabetes Maternal Grandmother   . Hypertension Maternal Grandmother   . Diabetes Maternal Grandfather   . Hypertension Maternal Grandfather   . Diabetes Paternal Grandmother   . Hypertension Paternal Grandmother   . Diabetes Paternal Grandfather   . Hypertension Paternal Grandfather     Social History   Tobacco Use  . Smoking status: Never Smoker  . Smokeless tobacco: Never Used  Substance Use Topics  . Alcohol use: No    Alcohol/week: 0.0 oz  . Drug use: No    Allergies: No Known Allergies  Medications Prior to Admission  Medication Sig Dispense Refill Last Dose  . Doxylamine-Pyridoxine 10-10 MG TBEC Take 2 tablets by mouth at bedtime. May take 1 tab in am and 1 tab in afternoon if needed for nausea 100 tablet 1   . ondansetron (ZOFRAN ODT) 4 MG disintegrating tablet Take 1 tablet (4 mg total) by mouth every 8 (eight) hours as needed for nausea or vomiting. 30 tablet 0     Review of Systems  Constitutional: Negative.   HENT: Negative.   Respiratory: Negative.   Gastrointestinal: Negative for abdominal pain, constipation  and diarrhea.  Genitourinary: Positive for vaginal bleeding.  Neurological: Negative.   Psychiatric/Behavioral: Negative.    Physical Exam   Blood pressure 103/61, pulse 63, temperature 98.3 F (36.8 C), temperature source Oral, resp. rate 16, weight 133 lb 4 oz (60.4 kg), last menstrual period 02/10/2017, SpO2 100 %, unknown if currently breastfeeding.  Physical Exam  Constitutional: She is oriented to person, place, and time. She appears well-developed.  HENT:  Head: Normocephalic.  Neck: Normal range of motion.  GI: Soft. She exhibits no distension and no mass. There is no tenderness. There is no rebound and no guarding.  Genitourinary:  Genitourinary Comments: Normal external female genitalia; no lesions in the vagina, no lesions on cervix. No CMT, suprapubic or adnexal tenderness. Cervix is long,  closed and thick.   Musculoskeletal: Normal range of motion.  Neurological: She is alert and oriented to person, place, and time.  Skin: Skin is warm and dry.  Psychiatric: She has a normal mood and affect.    MAU Course  Procedures  MDM -US shows 11 week 5 day pregnancy; no acute findings on [place].  US shows no SCH at this time.  -US findings discussed with Dr. Dareen Piano, who agrees that patient is stable for discharge.  Assessment and Plan   1. Vaginal bleeding affecting early pregnancy   2. Vaginal bleeding    2. Patient stable for discharge with pelvic rest precautions and bleeding/return precautions.   3. Clue cells present, but no signs and symptoms so will defer diagnosis of Bacterial Vaginosis at this time.   Charlesetta Garibaldi Kooistra 12/30/2017, 9:03 PM

## 2017-12-30 NOTE — MAU Note (Signed)
Was here last wk for bleeding. Started back on Sat, went to the ER in [place]- slight increase and spotting. Since Sunday, has been spotting off and on.

## 2017-12-31 LAB — GC/CHLAMYDIA PROBE AMP (~~LOC~~) NOT AT ARMC
CHLAMYDIA, DNA PROBE: NEGATIVE
NEISSERIA GONORRHEA: NEGATIVE

## 2018-01-06 LAB — OB RESULTS CONSOLE RUBELLA ANTIBODY, IGM: Rubella: IMMUNE

## 2018-01-06 LAB — OB RESULTS CONSOLE GC/CHLAMYDIA
CHLAMYDIA, DNA PROBE: NEGATIVE
Gonorrhea: NEGATIVE

## 2018-01-06 LAB — OB RESULTS CONSOLE HEPATITIS B SURFACE ANTIGEN: HEP B S AG: NEGATIVE

## 2018-01-06 LAB — OB RESULTS CONSOLE HIV ANTIBODY (ROUTINE TESTING): HIV: NONREACTIVE

## 2018-01-06 LAB — OB RESULTS CONSOLE RPR: RPR: NONREACTIVE

## 2018-02-10 ENCOUNTER — Inpatient Hospital Stay (HOSPITAL_BASED_OUTPATIENT_CLINIC_OR_DEPARTMENT_OTHER): Payer: Medicaid Other

## 2018-02-10 ENCOUNTER — Encounter (HOSPITAL_COMMUNITY): Payer: Self-pay

## 2018-02-10 ENCOUNTER — Inpatient Hospital Stay (HOSPITAL_COMMUNITY)
Admission: AD | Admit: 2018-02-10 | Discharge: 2018-02-10 | Disposition: A | Discharge status: Home / Self Care | Payer: Medicaid Other | Source: Ambulatory Visit | Attending: Obstetrics and Gynecology | Admitting: Obstetrics and Gynecology

## 2018-02-10 DIAGNOSIS — O9A212 Injury, poisoning and certain other consequences of external causes complicating pregnancy, second trimester: Secondary | ICD-10-CM | POA: Diagnosis not present

## 2018-02-10 DIAGNOSIS — W108XXA Fall (on) (from) other stairs and steps, initial encounter: Secondary | ICD-10-CM

## 2018-02-10 DIAGNOSIS — Y92038 Other place in apartment as the place of occurrence of the external cause: Secondary | ICD-10-CM | POA: Diagnosis not present

## 2018-02-10 DIAGNOSIS — S3991XA Unspecified injury of abdomen, initial encounter: Secondary | ICD-10-CM | POA: Insufficient documentation

## 2018-02-10 DIAGNOSIS — Z79899 Other long term (current) drug therapy: Secondary | ICD-10-CM | POA: Diagnosis not present

## 2018-02-10 DIAGNOSIS — O9A213 Injury, poisoning and certain other consequences of external causes complicating pregnancy, third trimester: Secondary | ICD-10-CM

## 2018-02-10 DIAGNOSIS — W109XXA Fall (on) (from) unspecified stairs and steps, initial encounter: Secondary | ICD-10-CM | POA: Diagnosis not present

## 2018-02-10 DIAGNOSIS — T1490XA Injury, unspecified, initial encounter: Secondary | ICD-10-CM

## 2018-02-10 DIAGNOSIS — Z3A17 17 weeks gestation of pregnancy: Secondary | ICD-10-CM

## 2018-02-10 LAB — URINALYSIS, ROUTINE W REFLEX MICROSCOPIC
Bilirubin Urine: NEGATIVE
Glucose, UA: NEGATIVE mg/dL
Hgb urine dipstick: NEGATIVE
Ketones, ur: NEGATIVE mg/dL
Leukocytes, UA: NEGATIVE
Nitrite: NEGATIVE
Protein, ur: 30 mg/dL — AB
Specific Gravity, Urine: 1.011 (ref 1.005–1.030)
pH: 6 (ref 5.0–8.0)

## 2018-02-10 NOTE — MAU Provider Note (Signed)
Chief Complaint: Fall   First Provider Initiated Contact with Patient 02/10/18 0140      SUBJECTIVE HPI: Christine Rose is a 27 y.o. G2P0010 at [redacted]w[redacted]d by LMP who presents to maternity admissions reporting fall down stairs this evening. She reports walking out of her apartment last night and slipped and tumble down steps. She reports hitting her back, right side, and buttock- does not know if she fully hit her abdomen as she was trying to protect her stomach but has scratches on her abdomen above umbilicus. She rates pain 5/10, specific to her back and right shoulder where her pain is- has not taken any medication for pain, she reports not liking to take medication. She denies abdominal pain, cramping, vaginal discharge or vaginal bleeding.    Past Medical History:  Diagnosis Date  . Allergic rhinitis   . ASCUS (atypical squamous cells of undetermined significance) on Pap smear 01/08/2012   hpv pos,follow up pap 09-21-12 Ascus, pap 05-20-14 Neg  . Chlamydia 2010 and 10/06/15  . Female infertility   . History of chicken pox   . Osgood-Schlatter's disease    hx  . Screening for chlamydial disease 09/2008   pos test  . Seasonal allergies   . 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 Vaginal Pap smear, abnormal    Past Surgical History:  Procedure Laterality Date  . NO PAST SURGERIES     Social History   Socioeconomic History  . Marital status: Married    Spouse name: Not on file  . Number of children: Not on file  . Years of education: Not on file  . Highest education level: Not on file  Occupational History  . Not on file  Social Needs  . Financial resource strain: Not on file  . Food insecurity:    Worry: Not on file    Inability: Not on file  . Transportation needs:    Medical: Not on file    Non-medical: Not on file  Tobacco Use  . Smoking status: Never Smoker  . Smokeless tobacco: Never Used  Substance and Sexual Activity  . Alcohol use: No   Alcohol/week: 0.0 oz  . Drug use: No  . Sexual activity: Not Currently    Partners: Male    Birth control/protection: None  Lifestyle  . Physical activity:    Days per week: Not on file    Minutes per session: Not on file  . Stress: Not on file  Relationships  . Social connections:    Talks on phone: Not on file    Gets together: Not on file    Attends religious service: Not on file    Active member of club or organization: Not on file    Attends meetings of clubs or organizations: Not on file    Relationship status: Not on file  . Intimate partner violence:    Fear of current or ex partner: Not on file    Emotionally abused: Not on file    Physically abused: Not on file    Forced sexual activity: Not on file  Other Topics Concern  . 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 interested in obgynearea but may do PT assistant    No exercise except work.   As off because of work schedule.  Negative TAD   Avoids milk    No current facility-administered medications on file prior to encounter.    Current Outpatient Medications on File Prior to Encounter  Medication Sig Dispense Refill  . ondansetron (ZOFRAN ODT) 4 MG disintegrating tablet Take 1 tablet (4 mg total) by mouth every 8 (eight) hours as needed for nausea or vomiting. 30 tablet 0  . Doxylamine-Pyridoxine 10-10 MG TBEC Take 2 tablets by mouth at bedtime. May take 1 tab in am and 1 tab in afternoon if needed for nausea 100 tablet 1   No Known Allergies  ROS:  Review of Systems  Respiratory: Negative.   Cardiovascular: Negative.   Gastrointestinal: Negative for abdominal pain, nausea and vomiting.  Genitourinary: Negative.   Musculoskeletal: Positive for back pain. Negative for neck pain.       Right shoulder pain   I have reviewed patient's Past Medical Hx, Surgical Hx, Family Hx, Social Hx,  medications and allergies.   Physical Exam   Patient Vitals for the past 24 hrs:  BP Temp Temp src Pulse Resp SpO2 Height Weight  02/10/18 0226 103/63 98.4 F (36.9 C) Oral 62 18 - - -  02/10/18 0052 112/68 98.8 F (37.1 C) Oral 73 18 100 % 5\' 4"  (1.626 m) 136 lb (61.7 kg)   Constitutional: Well-developed, well-nourished female in no acute distress.  Cardiovascular: normal rate Respiratory: normal effort GI: Abd soft, non-tender. Pos BS x 4 Neurologic: Alert and oriented x 4.  Skin: scratches, redness noted on shoulder, back and abdomen. Tender to touch.   FHT 145 by doppler  LAB RESULTS Results for orders placed or performed during the hospital encounter of 02/10/18 (from the past 24 hour(s))  Urinalysis, Routine w reflex microscopic     Status: Abnormal   Collection Time: 02/10/18 12:58 AM  Result Value Ref Range   Color, Urine YELLOW YELLOW   APPearance HAZY (A) CLEAR   Specific Gravity, Urine 1.011 1.005 - 1.030   pH 6.0 5.0 - 8.0   Glucose, UA NEGATIVE NEGATIVE mg/dL   Hgb urine dipstick NEGATIVE NEGATIVE   Bilirubin Urine NEGATIVE NEGATIVE   Ketones, ur NEGATIVE NEGATIVE mg/dL   Protein, ur 30 (A) NEGATIVE mg/dL   Nitrite NEGATIVE NEGATIVE   Leukocytes, UA NEGATIVE NEGATIVE   RBC / HPF 0-5 0 - 5 RBC/hpf   WBC, UA 0-5 0 - 5 WBC/hpf   Bacteria, UA RARE (A) NONE SEEN   Squamous Epithelial / LPF 6-10 0 - 5   Mucus PRESENT     --/--/A POS (01/27 0044)  IMAGING: FHR 136 Presentation: Breech  Placenta: Posterior above os  AFV: WNL  No placental abruption identified   MAU Management/MDM: Orders Placed This Encounter  Procedures  . [place] MFM OB LIMITED  . Urinalysis, Routine w reflex microscopic  . Discharge patient Discharge disposition: 01-Home or Self Care; Discharge patient date: 02/10/2018   UA- negative  Consult Dr Dareen Piano with assessment and [place] results- okay to discharge home with follow up in the office as scheduled, notify to call office if she has any  vaginal bleeding or abdominal cramping.    Tylenol offered for pain medication- patient declined medication as she does not like taking medication. Discussed pain management at home in addition to Heat and Ice packs to the sore areas. Patient discharged with strict instructions to call office with any vaginal bleeding and/or abdominal cramping. Patient verbalizes understanding.   ASSESSMENT 1. Fall down stairs, initial encounter   2. Abdominal  trauma, initial encounter   3. [redacted] weeks gestation of pregnancy     PLAN Discharge home Follow up as scheduled in the office  Tylenol PRN for home use  Return to MAU as needed for reasons discussed   Follow-up Information    [facility] Follow up.   Why:  Follow up as scheduled for pre[facility] appointments. Call the office with occurence of vaginal bleeding and/or abdominal pain  Contact information: [address] [place] [id number] [phone]           Allergies as of 02/10/2018   No Known Allergies     Medication List    TAKE these medications   Doxylamine-Pyridoxine 10-10 MG Tbec Take 2 tablets by mouth at bedtime. May take 1 tab in am and 1 tab in afternoon if needed for nausea   ondansetron 4 MG disintegrating tablet Commonly known as:  ZOFRAN ODT Take 1 tablet (4 mg total) by mouth every 8 (eight) hours as needed for nausea or vomiting.      Steward DroneVeronica Ellie Bryand  Certified Nurse-Midwife 02/10/2018  2:20 AM

## 2018-02-10 NOTE — MAU Note (Addendum)
Pt was going down stairs and fell on right side and tumbled over. Landed on r buttocks/tailbone and shoulder. Pain 5/10.  Denies LOF or bleeding. Denies cramping or abdominal pain.

## 2018-03-03 ENCOUNTER — Inpatient Hospital Stay (HOSPITAL_COMMUNITY)
Admission: AD | Admit: 2018-03-03 | Discharge: 2018-03-03 | Disposition: A | Discharge status: Home / Self Care | Payer: Medicaid Other | Source: Ambulatory Visit | Attending: Obstetrics and Gynecology | Admitting: Obstetrics and Gynecology

## 2018-03-03 ENCOUNTER — Inpatient Hospital Stay (HOSPITAL_BASED_OUTPATIENT_CLINIC_OR_DEPARTMENT_OTHER): Payer: Medicaid Other

## 2018-03-03 ENCOUNTER — Other Ambulatory Visit: Payer: Self-pay

## 2018-03-03 ENCOUNTER — Encounter (HOSPITAL_COMMUNITY): Payer: Self-pay

## 2018-03-03 DIAGNOSIS — O4692 Antepartum hemorrhage, unspecified, second trimester: Secondary | ICD-10-CM | POA: Diagnosis not present

## 2018-03-03 DIAGNOSIS — Z3A20 20 weeks gestation of pregnancy: Secondary | ICD-10-CM

## 2018-03-03 DIAGNOSIS — O23592 Infection of other part of genital tract in pregnancy, second trimester: Secondary | ICD-10-CM | POA: Diagnosis not present

## 2018-03-03 DIAGNOSIS — O209 Hemorrhage in early pregnancy, unspecified: Secondary | ICD-10-CM | POA: Diagnosis not present

## 2018-03-03 DIAGNOSIS — N760 Acute vaginitis: Secondary | ICD-10-CM

## 2018-03-03 DIAGNOSIS — B9689 Other specified bacterial agents as the cause of diseases classified elsewhere: Secondary | ICD-10-CM | POA: Insufficient documentation

## 2018-03-03 DIAGNOSIS — Z3482 Encounter for supervision of other normal pregnancy, second trimester: Secondary | ICD-10-CM

## 2018-03-03 DIAGNOSIS — Z79899 Other long term (current) drug therapy: Secondary | ICD-10-CM | POA: Insufficient documentation

## 2018-03-03 LAB — CBC
HCT: 34.3 % — ABNORMAL LOW (ref 36.0–46.0)
Hemoglobin: 11.2 g/dL — ABNORMAL LOW (ref 12.0–15.0)
MCH: 28.6 pg (ref 26.0–34.0)
MCHC: 32.7 g/dL (ref 30.0–36.0)
MCV: 87.5 fL (ref 78.0–100.0)
Platelets: 255 10*3/uL (ref 150–400)
RBC: 3.92 MIL/uL (ref 3.87–5.11)
RDW: 13.5 % (ref 11.5–15.5)
WBC: 8 10*3/uL (ref 4.0–10.5)

## 2018-03-03 LAB — WET PREP, GENITAL
SPERM: NONE SEEN
TRICH WET PREP: NONE SEEN
Yeast Wet Prep HPF POC: NONE SEEN

## 2018-03-03 MED ORDER — METRONIDAZOLE 500 MG PO TABS: 500.0000 mg | ORAL_TABLET | Freq: Two times a day (BID) | ORAL | 0 refills | Status: DC

## 2018-03-03 NOTE — MAU Note (Signed)
Urine sent to lab 

## 2018-03-03 NOTE — MAU Note (Signed)
Feels irregular/occasional movements.  Has not felt any since onset of bleeding

## 2018-03-03 NOTE — MAU Note (Signed)
Started spotting on Sunday. Only saw when she wiped Sunday and Monday.   During the night she woke up and there was a lot of blood - would have filled a pad probably. No pain.  Was 5 min late today for anatomy scan and appt, was told to come here. Hx of first trimester bleeding from an infection.

## 2018-03-03 NOTE — MAU Provider Note (Addendum)
History     CSN: [id number]  Arrival date and time: 03/03/18 1137   First Provider Initiated Contact with Patient 03/03/18 1232      Chief Complaint  Patient presents with  . Vaginal Bleeding   HPI Christine Rose is a 27 y.o. G2P0010 at [redacted]w[redacted]d who presents to MAU with chief complaint of vaginal bleeding. States she saw blood on toilet paper when she wiped after voiding, then enough bleeding to fill a single pad x 1, now spotting. Denies leaking of fluid, fever, falls, or recent illness.    OB History    Gravida  2   Para  0   Term  0   Preterm  0   AB  1   Living  0     SAB  1   TAB  0   Ectopic  0   Multiple  0   Live Births              Past Medical History:  Diagnosis Date  . Allergic rhinitis   . ASCUS (atypical squamous cells of undetermined significance) on Pap smear 01/08/2012   hpv pos,follow up pap 09-21-12 Ascus, pap 05-20-14 Neg  . Chlamydia 2010 and 10/06/15  . Female infertility   . History of chicken pox   . Osgood-Schlatter's disease    hx  . Screening for chlamydial disease 09/2008   pos test  . Seasonal allergies   . 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 Vaginal Pap smear, abnormal     Past Surgical History:  Procedure Laterality Date  . NO PAST SURGERIES      Family History  Problem Relation Age of Onset  . Other Mother        large ASD declining surgical intervention  . Stroke Mother 48  . Breast cancer Maternal Grandmother 16  . Diabetes Maternal Grandmother   . Hypertension Maternal Grandmother   . Diabetes Maternal Grandfather   . Hypertension Maternal Grandfather   . Diabetes Paternal Grandmother   . Hypertension Paternal Grandmother   . Diabetes Paternal Grandfather   . Hypertension Paternal Grandfather     Social History   Tobacco Use  . Smoking status: Never Smoker  . Smokeless tobacco: Never Used  Substance Use Topics  . Alcohol use: No    Alcohol/week: 0.0 oz  . Drug use: No     Allergies: No Known Allergies  Medications Prior to Admission  Medication Sig Dispense Refill Last Dose  . Doxylamine-Pyridoxine 10-10 MG TBEC Take 2 tablets by mouth at bedtime. May take 1 tab in am and 1 tab in afternoon if needed for nausea 100 tablet 1   . ondansetron (ZOFRAN ODT) 4 MG disintegrating tablet Take 1 tablet (4 mg total) by mouth every 8 (eight) hours as needed for nausea or vomiting. 30 tablet 0 Past Month at Unknown time    Review of Systems  Constitutional: Negative for chills and fever.  Gastrointestinal: Positive for abdominal pain.  Genitourinary: Positive for vaginal bleeding.  Neurological: Negative for headaches.  All other systems reviewed and are negative.  Physical Exam   Blood pressure 113/71, pulse 76, temperature 98.2 F (36.8 C), temperature source Oral, resp. rate 16, weight 138 lb 12 oz (62.9 kg), last menstrual period 02/10/2017, SpO2 100 %, unknown if currently breastfeeding.  Physical Exam  Nursing note and vitals reviewed. Constitutional: She is oriented to person, place, and time. She appears well-developed and well-nourished.  Cardiovascular: Normal rate,  regular rhythm, normal heart sounds and intact distal pulses.  Respiratory: Effort normal and breath sounds normal.  GI: She exhibits no mass. There is no tenderness. There is no rebound.  gravid  Genitourinary: Vagina normal and uterus normal. No vaginal discharge found.  Genitourinary Comments: No bleeding visualized on SSE  Neurological: She is alert and oriented to person, place, and time. She has normal reflexes.  Skin: Skin is warm and dry.  Psychiatric: She has a normal mood and affect. Her behavior is normal. Judgment and thought content normal.    MAU Course  Procedures  MDM Patient Vitals for the past 24 hrs:  BP Temp Temp src Pulse Resp SpO2 Weight  03/03/18 1154 113/71 98.2 F (36.8 C) Oral 76 16 100 % 138 lb 12 oz (62.9 kg)    Orders Placed This Encounter   Procedures  . Wet prep, genital  . US MFM OB LIMITED  . CBC  . Discharge patient   [place] Mfm Ob Limited  Result Date: 03/03/2018 ----------------------------------------------------------------------  OBSTETRICS REPORT                      (Signed Final 03/03/2018 02:00 pm) ---------------------------------------------------------------------- Patient Info  ID #:       [id number]                          D.O.B.:  06-Jul-1991 (26 yrs)  Name:       Christine Rose              Visit Date: 03/03/2018 01:00 pm ---------------------------------------------------------------------- Performed By  Performed By:     Eden Lathearrie Stalter BS      Referred By:      MAU Nursing-                    RDMS RVT                                 MAU/Triage  Attending:        Noralee Spaceavi Shankar MD        [facility] ---------------------------------------------------------------------- Orders   #  Description                                 Code   1  US MFM OB LIMITED                           [id number] [id number]  ----------------------------------------------------------------------   #  Ordered By               Order #        Accession #    Episode #   1  Lelon MastSAMANTHA                 [id number]      [id number]     [id number]      Gibbs Naugle  ---------------------------------------------------------------------- Indications   [redacted] weeks gestation of pregnancy                Z3A.20   Vaginal bleeding in pregnancy, second          O46.92   trimester  ---------------------------------------------------------------------- OB History  Gravidity:    2  SAB:   1  Living:       0 ---------------------------------------------------------------------- Fetal Evaluation  Num Of Fetuses:     1  Fetal Heart         133  Rate(bpm):  Cardiac Activity:   Observed  Presentation:       Breech  Placenta:           Posterior  P. Cord Insertion:  Visualized  Amniotic Fluid  AFI FV:      Subjectively within normal limits                               Largest Pocket(cm)                              5.4  Comment:    No placental abruption or previa identified. ---------------------------------------------------------------------- Gestational Age  LMP:           20w 2d        Date:  10/12/17                 EDD:   07/19/18  Best:          Cherylann Parr 2d     Det. By:  LMP  (10/12/17)          EDD:   07/19/18 ---------------------------------------------------------------------- Cervix Uterus Adnexa  Cervix  Length:            3.1  cm.  Normal appearance by transabdominal scan.  Uterus  No abnormality visualized.  Left Ovary  Within normal limits.  Right Ovary  Not visualized.  Cul De Sac:   No free fluid seen.  Adnexa:       No abnormality visualized. ---------------------------------------------------------------------- Impression  History of recent vaginal bleeding (spotting).  A limited ultrasound study was performed. Amniotic fluid is  normal and good fetal activity is seen. Placenta looks normal  with no evidence of previa or retroplacental bleeding.  We reassured the patient of the findings.  Fetal anatomical survey was not performed. ---------------------------------------------------------------------- Recommendations  Follow-up scans as clinically indicated. ----------------------------------------------------------------------                  Noralee Space, MD Electronically Signed Final Report   03/03/2018 02:00 pm ----------------------------------------------------------------------   Meds ordered this encounter  Medications  . metroNIDAZOLE (FLAGYL) 500 MG tablet    Sig: Take 1 tablet (500 mg total) by mouth 2 (two) times daily.    Dispense:  14 tablet    Refill:  0    Order Specific Question:   Supervising Provider    Answer:   Reva Bores [[id number]]    Assessment and Plan  --27 y.o. G2P0010 at [redacted]w[redacted]d  --No concerning signs on ultrasound --Bacterial vaginosis in pregnancy, rx sent to patient pharmacy --Discharge home in stable  condition  Presentation, clinical findings, and plan discussed with Dr. Tenny Craw.    Calvert Cantor, CNM 03/03/2018, 2:22 PM

## 2018-03-03 NOTE — Discharge Instructions (Signed)
Metronidazole tablets or capsules What is this medicine? METRONIDAZOLE (me troe NI da zole) is an antiinfective. It is used to treat certain kinds of bacterial and protozoal infections. It will not work for colds, flu, or other viral infections. This medicine may be used for other purposes; ask your health care provider or pharmacist if you have questions. COMMON BRAND NAME(S): Flagyl What should I tell my health care provider before I take this medicine? They need to know if you have any of these conditions: -anemia or other blood disorders -disease of the nervous system -fungal or yeast infection -if you drink alcohol containing drinks -liver disease -seizures -an unusual or allergic reaction to metronidazole, or other medicines, foods, dyes, or preservatives -pregnant or trying to get pregnant -breast-feeding How should I use this medicine? Take this medicine by mouth with a full glass of water. Follow the directions on the prescription label. Take your medicine at regular intervals. Do not take your medicine more often than directed. Take all of your medicine as directed even if you think you are better. Do not skip doses or stop your medicine early. Talk to your pediatrician regarding the use of this medicine in children. Special care may be needed. Overdosage: If you think you have taken too much of this medicine contact a poison control center or emergency room at once. NOTE: This medicine is only for you. Do not share this medicine with others. What if I miss a dose? If you miss a dose, take it as soon as you can. If it is almost time for your next dose, take only that dose. Do not take double or extra doses. What may interact with this medicine? Do not take this medicine with any of the following medications: -alcohol or any product that contains alcohol -amprenavir oral solution -cisapride -disulfiram -dofetilide -dronedarone -paclitaxel injection -pimozide -ritonavir oral  solution -sertraline oral solution -sulfamethoxazole-trimethoprim injection -thioridazine -ziprasidone This medicine may also interact with the following medications: -birth control pills -cimetidine -lithium -other medicines that prolong the QT interval (cause an abnormal heart rhythm) -phenobarbital -phenytoin -warfarin This list may not describe all possible interactions. Give your health care provider a list of all the medicines, herbs, non-prescription drugs, or dietary supplements you use. Also tell them if you smoke, drink alcohol, or use illegal drugs. Some items may interact with your medicine. What should I watch for while using this medicine? Tell your doctor or health care professional if your symptoms do not improve or if they get worse. You may get drowsy or dizzy. Do not drive, use machinery, or do anything that needs mental alertness until you know how this medicine affects you. Do not stand or sit up quickly, especially if you are an older patient. This reduces the risk of dizzy or fainting spells. Avoid alcoholic drinks while you are taking this medicine and for three days afterward. Alcohol may make you feel dizzy, sick, or flushed. If you are being treated for a sexually transmitted disease, avoid sexual contact until you have finished your treatment. Your sexual partner may also need treatment. What side effects may I notice from receiving this medicine? Side effects that you should report to your doctor or health care professional as soon as possible: -allergic reactions like skin rash or hives, swelling of the face, lips, or tongue -confusion, clumsiness -difficulty speaking -discolored or sore mouth -dizziness -fever, infection -numbness, tingling, pain or weakness in the hands or feet -trouble passing urine or change in the amount of  urine -redness, blistering, peeling or loosening of the skin, including inside the mouth -seizures -unusually weak or  tired -vaginal irritation, dryness, or discharge Side effects that usually do not require medical attention (report to your doctor or health care professional if they continue or are bothersome): -diarrhea -headache -irritability -metallic taste -nausea -stomach pain or cramps -trouble sleeping This list may not describe all possible side effects. Call your doctor for medical advice about side effects. You may report side effects to FDA at 1-800-FDA-1088. Where should I keep my medicine? Keep out of the reach of children. Store at room temperature below 25 degrees C (77 degrees F). Protect from light. Keep container tightly closed. Throw away any unused medicine after the expiration date. NOTE: This sheet is a summary. It may not cover all possible information. If you have questions about this medicine, talk to your doctor, pharmacist, or health care provider.  2018 Elsevier/Gold Standard (2013-03-05 14:08:39)

## 2018-03-04 LAB — GC/CHLAMYDIA PROBE AMP (~~LOC~~) NOT AT ARMC
Chlamydia: NEGATIVE
NEISSERIA GONORRHEA: POSITIVE — AB

## 2018-03-06 ENCOUNTER — Telehealth: Payer: Self-pay | Admitting: *Deleted

## 2018-03-06 ENCOUNTER — Ambulatory Visit: Payer: Medicaid Other

## 2018-03-06 NOTE — Telephone Encounter (Signed)
Pt called office and stated that she cannot keep appt as scheduled today for STI treatment due to work schedule. She was seen @ MAU on 7/23 and tested positive for GC. She stated that she has [facility]. I advised pt to inform the nurse or doctor that she requires this treatment. She has already been advised of need for partner to be treated as well. Pt agreed and voiced understanding of instructions given. I called [facility] office and confirmed she does have appt today. I spoke w/Stacy and informed her of +GC and need for treatment.

## 2018-03-30 ENCOUNTER — Encounter (HOSPITAL_COMMUNITY): Payer: Self-pay | Admitting: Emergency Medicine

## 2018-03-30 ENCOUNTER — Emergency Department (HOSPITAL_COMMUNITY)
Admission: EM | Admit: 2018-03-30 | Discharge: 2018-03-30 | Disposition: A | Discharge status: Home / Self Care | Payer: Medicaid Other | Attending: Emergency Medicine | Admitting: Emergency Medicine

## 2018-03-30 ENCOUNTER — Other Ambulatory Visit: Payer: Self-pay

## 2018-03-30 DIAGNOSIS — O26892 Other specified pregnancy related conditions, second trimester: Secondary | ICD-10-CM | POA: Insufficient documentation

## 2018-03-30 DIAGNOSIS — Z79899 Other long term (current) drug therapy: Secondary | ICD-10-CM | POA: Insufficient documentation

## 2018-03-30 DIAGNOSIS — R55 Syncope and collapse: Secondary | ICD-10-CM | POA: Diagnosis not present

## 2018-03-30 DIAGNOSIS — Z3A24 24 weeks gestation of pregnancy: Secondary | ICD-10-CM | POA: Diagnosis not present

## 2018-03-30 LAB — MAGNESIUM: Magnesium: 2 mg/dL (ref 1.7–2.4)

## 2018-03-30 LAB — BASIC METABOLIC PANEL
ANION GAP: 7 (ref 5–15)
BUN: 6 mg/dL (ref 6–20)
CO2: 24 mmol/L (ref 22–32)
Calcium: 9 mg/dL (ref 8.9–10.3)
Chloride: 107 mmol/L (ref 98–111)
Creatinine, Ser: 0.52 mg/dL (ref 0.44–1.00)
GFR calc Af Amer: >60 mL/min (ref >60)
GLUCOSE: 56 mg/dL — AB (ref 70–99)
POTASSIUM: 3.6 mmol/L (ref 3.5–5.1)
Sodium: 138 mmol/L (ref 135–145)

## 2018-03-30 LAB — CBC WITH DIFFERENTIAL/PLATELET
Abs Immature Granulocytes: 0.1 K/uL (ref 0.0–0.1)
Basophils Absolute: 0 K/uL (ref 0.0–0.1)
Basophils Relative: 0 %
Eosinophils Absolute: 0.1 K/uL (ref 0.0–0.7)
Eosinophils Relative: 1 %
HCT: 37.6 % (ref 36.0–46.0)
Hemoglobin: 11.5 g/dL — ABNORMAL LOW (ref 12.0–15.0)
Immature Granulocytes: 1 %
Lymphocytes Relative: 13 %
Lymphs Abs: 1 K/uL (ref 0.7–4.0)
MCH: 27.9 pg (ref 26.0–34.0)
MCHC: 30.6 g/dL (ref 30.0–36.0)
MCV: 91.3 fL (ref 78.0–100.0)
Monocytes Absolute: 0.6 K/uL (ref 0.1–1.0)
Monocytes Relative: 8 %
Neutro Abs: 6 K/uL (ref 1.7–7.7)
Neutrophils Relative %: 77 %
Platelets: 253 K/uL (ref 150–400)
RBC: 4.12 MIL/uL (ref 3.87–5.11)
RDW: 12.7 % (ref 11.5–15.5)
WBC: 7.9 K/uL (ref 4.0–10.5)

## 2018-03-30 LAB — URINALYSIS, ROUTINE W REFLEX MICROSCOPIC
Bilirubin Urine: NEGATIVE
GLUCOSE, UA: NEGATIVE mg/dL
HGB URINE DIPSTICK: NEGATIVE
Ketones, ur: NEGATIVE mg/dL
LEUKOCYTES UA: NEGATIVE
Nitrite: NEGATIVE
PH: 6 (ref 5.0–8.0)
PROTEIN: NEGATIVE mg/dL
SPECIFIC GRAVITY, URINE: 1.006 (ref 1.005–1.030)

## 2018-03-30 MED ORDER — SODIUM CHLORIDE 0.9 % IV BOLUS
500.0000 mL | Freq: Once | INTRAVENOUS | Status: AC
  Administered 2018-03-30: 500 mL via INTRAVENOUS

## 2018-03-30 NOTE — Progress Notes (Signed)
Spoke with Dr. Chestine Sporelark. Pt is a G2P0 at 24 1/[redacted] weeks gestation here because she had a syncopal episode at work. Pt says she had a headache, became nauseous, diaphoretic, stood up to walk and then passed out in the floor. Pt says she fell on her rt side. No vaginal bleeding or leaking of fluid. FHR baseline is 145BPM, moderate variability, 10x10 naccels, occasional variable decels. No uc's.Pt has an appointment with Dr. Ophelia Charterlark's office on [place].She is getting IVF. CBC with diff and U/A are nl. Blood pressures are normal. No c/o of headache or discomfort at this time. Pt can be d/c home. OB cleared. ED staff notified.

## 2018-03-30 NOTE — Discharge Instructions (Addendum)
Return for any problem.  Follow-up with your already established OB/GYN as instructed.

## 2018-03-30 NOTE — ED Notes (Signed)
Rapid response OB RN at bedside with patient.

## 2018-03-30 NOTE — ED Provider Notes (Signed)
[facility] EMERGENCY [facility]PARTMENT Provider Note   CSN: [id number] Arrival date & time: 03/30/18  1103     History   Chief Complaint Chief Complaint  Patient presents with  . Loss of Consciousness    HPI Christine Rose is a 27 y.o. female.  27 year old female, G2P0, [redacted] weeks pregnant presents following a syncopal event.  Patient reports that she was at work when she felt lightheaded.  She stood up and then passed out.  She did not suffer any significant injury when she fell.  She is unsure for exactly how long she was unconscious.  Upon arrival to the ED she is without significant complaint.  She denies associated headache, nausea, vomiting, chest pain, shortness of breath, or other acute complaint.  She denies abdominal pain.  She has felt fetal movement since the event.    The history is provided by the patient.  Loss of Consciousness   This is a new problem. The current episode started less than 1 hour ago. The problem occurs rarely. The problem has not changed since onset.Length of episode of loss of consciousness: uncertain. The problem is associated with normal activity.    Past Medical History:  Diagnosis Date  . Allergic rhinitis   . ASCUS (atypical squamous cells of undetermined significance) on Pap smear 01/08/2012   hpv pos,follow up pap 09-21-12 Ascus, pap 05-20-14 Neg  . Chlamydia 2010 and 10/06/15  . Female infertility   . History of chicken pox   . Osgood-Schlatter's disease    hx  . Screening for chlamydial disease 09/2008   pos test  . Seasonal allergies   . 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 Vaginal Pap smear, abnormal     Patient Active Problem List   Diagnosis Date Noted  . Miscarriage 09/27/2017  . Right upper quadrant pain 05/20/2014  . Acute right lower quadrant pain 05/20/2014  . Other specified fever 05/20/2014  . Abnormal Pap smear 07/21/2012  . ASCUS (atypical squamous cells of undetermined significance)  on Pap smear 01/08/2012  . Routine gynecological examination 12/27/2011  . Surveillance for medroxyprogesterone contraception 12/27/2011  . Contraceptive management 03/04/2011  . Encounter for surveillance of injectable contraceptive 03/04/2011  . Allergic rhinitis   . Screening for STD (sexually transmitted disease) 12/26/2010  . Preventative health care 12/26/2010  . CHEST PAIN, ACUTE 05/25/2010  . ALLERGIC RHINITIS 12/23/2009  . GERD 12/23/2009  . HAND PAIN, LEFT 02/28/2009  . BACK PAIN 06/22/2008  . UNSPECIFIED DISORDER OF SKIN&SUBCUTANEOUS TISSUE 06/29/2007  . EXCESSIVE MENSTRUATION 06/04/2007    Past Surgical History:  Procedure Laterality Date  . NO PAST SURGERIES       OB History    Gravida  2   Para  0   Term  0   Preterm  0   AB  1   Living  0     SAB  1   TAB  0   Ectopic  0   Multiple  0   Live Births               Home Medications    Prior to Admission medications   Medication Sig Start Date End Date Taking? Authorizing Provider  Doxylamine-Pyridoxine 10-10 MG TBEC Take 2 tablets by mouth at bedtime. May take 1 tab in am and 1 tab in afternoon if needed for nausea 12/26/17   Donette LarryBhambri, Melanie, CNM  metroNIDAZOLE (FLAGYL) 500 MG tablet Take 1 tablet (500 mg total)  by mouth 2 (two) times daily. 03/03/18   Calvert Cantor, CNM  ondansetron (ZOFRAN ODT) 4 MG disintegrating tablet Take 1 tablet (4 mg total) by mouth every 8 (eight) hours as needed for nausea or vomiting. 12/26/17   Donette Larry, CNM    Family History Family History  Problem Relation Age of Onset  . Other Mother        large ASD declining surgical intervention  . Stroke Mother 40  . Breast cancer Maternal Grandmother 64  . Diabetes Maternal Grandmother   . Hypertension Maternal Grandmother   . Diabetes Maternal Grandfather   . Hypertension Maternal Grandfather   . Diabetes Paternal Grandmother   . Hypertension Paternal Grandmother   . Diabetes Paternal Grandfather    . Hypertension Paternal Grandfather     Social History Social History   Tobacco Use  . Smoking status: Never Smoker  . Smokeless tobacco: Never Used  Substance Use Topics  . Alcohol use: No    Alcohol/week: 0.0 standard drinks  . Drug use: No     Allergies   Patient has no known allergies.   Review of Systems Review of Systems  Cardiovascular: Positive for syncope.  Neurological: Positive for syncope.  All other systems reviewed and are negative.    Physical Exam Updated Vital Signs BP 110/79   Pulse 62   Temp 98.2 F (36.8 C) (Oral)   Resp 17   Ht 5\' 4"  (1.626 m)   Wt 64 kg   LMP 10/12/2017   SpO2 100%   BMI 24.20 kg/m   Physical Exam  Constitutional: She is oriented to person, place, and time. She appears well-developed and well-nourished. No distress.  HENT:  Head: Normocephalic and atraumatic.  Mouth/Throat: Oropharynx is clear and moist.  Eyes: Pupils are equal, round, and reactive to light. Conjunctivae and EOM are normal.  Neck: Normal range of motion. Neck supple.  Cardiovascular: Normal rate, regular rhythm and normal heart sounds.  Pulmonary/Chest: Effort normal and breath sounds normal. No respiratory distress.  Abdominal: Soft. She exhibits no distension. There is no tenderness.  Musculoskeletal: Normal range of motion. She exhibits no edema or deformity.  Neurological: She is alert and oriented to person, place, and time.  Alert and oriented x4 Normal speech No facial droop 5 out of 5 strength in all 4 extremities   Skin: Skin is warm and dry.  Psychiatric: She has a normal mood and affect.  Nursing note and vitals reviewed.    ED Treatments / Results  Labs (all labs ordered are listed, but only abnormal results are displayed) Labs Reviewed  BASIC METABOLIC PANEL - Abnormal; Notable for the following components:      Result Value   Glucose, Bld 56 (*)    All other components within normal limits  CBC WITH DIFFERENTIAL/PLATELET -  Abnormal; Notable for the following components:   Hemoglobin 11.5 (*)    All other components within normal limits  URINALYSIS, ROUTINE W REFLEX MICROSCOPIC - Abnormal; Notable for the following components:   Color, Urine STRAW (*)    All other components within normal limits  MAGNESIUM    EKG EKG Interpretation  Date/Time:  Monday March 30 2018 11:17:12 EDT Ventricular Rate:  63 PR Interval:    QRS Duration: 93 QT Interval:  412 QTC Calculation: 422 R Axis:   54 Text Interpretation:  Ectopic atrial rhythm Short PR interval Consider left atrial enlargement Confirmed by Kristine Royal [phone]) on 03/30/2018 11:34:28 AM Also confirmed by Rodena Medin,  Theron AristaPeter [phone]), editor Barbette HairCassel, Kerry [phone])  on 03/30/2018 12:27:15 PM   Radiology No results found.  Procedures Procedures (including critical care time)  Medications Ordered in ED Medications  sodium chloride 0.9 % bolus 500 mL (500 mLs Intravenous New Bag/Given 03/30/18 1154)     Initial Impression / Assessment and Plan / ED Course  I have reviewed the triage vital signs and the nursing notes.  Pertinent labs & imaging results that were available during my care of the patient were reviewed by me and considered in my medical decision making (see chart for details).     MDM  Screen complete  Patient is presenting for evaluation of syncope in the setting of pregnancy.  Patient's initial presentation is reassuring.  Screening labs are without significant abnormality.  Patient has been evaluated by GYN without evidence of significant fetal distress.  Patient appears to be safe to discharge.  She has established follow-up already with her OB/GYN for later this week.  Strict return precautions given and understood.  Close follow-up is stressed.  Patient is advised to drink plenty of fluids and to maintain snacks close at hand.   Final Clinical Impressions(s) / ED Diagnoses   Final diagnoses:  Syncope, unspecified syncope type     ED Discharge Orders    None       Wynetta FinesMessick, Haadi Santellan C, MD 03/30/18 1313

## 2018-03-30 NOTE — Progress Notes (Signed)
Pt is a G2P0 at 24 1/[redacted] weeks gestation here because she had a syncopal episode at work. Pt says she was sitting at her desk when she had blurred vision , headache, became nauseous, and diaphoretic. She got up, started to walk, and then passed out. Pt says she fell on her rt side. No vaginal bleeding or leaking of fluid. Pt says that she did not have a headache when she woke up and she does not have one now.No dizziness, blurred vision, or spot before her eyes. Says she gets [facility] care at [facility]. Says she has an appointment on Wed, 04/01/18.

## 2018-03-30 NOTE — ED Triage Notes (Signed)
Pt arrives via EMS from work. Pt reports feeling hot while sitting at her desk. Went to walking, had syncopal event in hallway hit her head, skinned knee, landed on right abdomen. Pt 26mo preg. Notes fetal movement on arrival. CBG initally 55. Ate some candy and some OJ, recheck 78. EKG SR. Systolic bp 102.

## 2018-06-25 LAB — OB RESULTS CONSOLE GBS: GBS: POSITIVE

## 2018-07-06 ENCOUNTER — Inpatient Hospital Stay (HOSPITAL_COMMUNITY): Payer: Medicaid Other | Admitting: Anesthesiology

## 2018-07-06 ENCOUNTER — Other Ambulatory Visit: Payer: Self-pay

## 2018-07-06 ENCOUNTER — Encounter (HOSPITAL_COMMUNITY): Payer: Self-pay

## 2018-07-06 ENCOUNTER — Inpatient Hospital Stay (HOSPITAL_COMMUNITY)
Admission: AD | Admit: 2018-07-06 | Discharge: 2018-07-08 | DRG: 807 | Disposition: A | Discharge status: Home / Self Care | Payer: Medicaid Other | Attending: Obstetrics | Admitting: Obstetrics

## 2018-07-06 DIAGNOSIS — O99824 Streptococcus B carrier state complicating childbirth: Secondary | ICD-10-CM | POA: Diagnosis present

## 2018-07-06 DIAGNOSIS — Z3A38 38 weeks gestation of pregnancy: Secondary | ICD-10-CM | POA: Diagnosis not present

## 2018-07-06 DIAGNOSIS — O4290 Premature rupture of membranes, unspecified as to length of time between rupture and onset of labor, unspecified weeks of gestation: Secondary | ICD-10-CM | POA: Diagnosis present

## 2018-07-06 DIAGNOSIS — Z3483 Encounter for supervision of other normal pregnancy, third trimester: Secondary | ICD-10-CM | POA: Diagnosis present

## 2018-07-06 LAB — CBC
HEMATOCRIT: 35.8 % — AB (ref 36.0–46.0)
Hemoglobin: 11.6 g/dL — ABNORMAL LOW (ref 12.0–15.0)
MCH: 28.4 pg (ref 26.0–34.0)
MCHC: 32.4 g/dL (ref 30.0–36.0)
MCV: 87.5 fL (ref 80.0–100.0)
NRBC: 0 % (ref 0.0–0.2)
Platelets: 271 10*3/uL (ref 150–400)
RBC: 4.09 MIL/uL (ref 3.87–5.11)
RDW: 13.9 % (ref 11.5–15.5)
WBC: 7.9 10*3/uL (ref 4.0–10.5)

## 2018-07-06 LAB — TYPE AND SCREEN
ABO/RH(D): A POS
ANTIBODY SCREEN: NEGATIVE

## 2018-07-06 MED ORDER — LACTATED RINGERS IV SOLN
INTRAVENOUS | Status: DC
  Administered 2018-07-06 (×2): via INTRAVENOUS

## 2018-07-06 MED ORDER — OXYTOCIN BOLUS FROM INFUSION
500.0000 mL | Freq: Once | INTRAVENOUS | Status: AC
  Administered 2018-07-07: 500 mL via INTRAVENOUS

## 2018-07-06 MED ORDER — PHENYLEPHRINE 40 MCG/ML (10ML) SYRINGE FOR IV PUSH (FOR BLOOD PRESSURE SUPPORT)
PREFILLED_SYRINGE | INTRAVENOUS | Status: AC
  Filled 2018-07-06: qty 20

## 2018-07-06 MED ORDER — PHENYLEPHRINE 40 MCG/ML (10ML) SYRINGE FOR IV PUSH (FOR BLOOD PRESSURE SUPPORT)
80.0000 ug | PREFILLED_SYRINGE | INTRAVENOUS | Status: DC | PRN
  Filled 2018-07-06: qty 5

## 2018-07-06 MED ORDER — MISOPROSTOL 25 MCG QUARTER TABLET
25.0000 ug | ORAL_TABLET | ORAL | Status: DC | PRN
  Administered 2018-07-06 (×2): 25 ug via BUCCAL
  Filled 2018-07-06 (×2): qty 1

## 2018-07-06 MED ORDER — PENICILLIN G 3 MILLION UNITS IVPB - SIMPLE MED
3.0000 10*6.[IU] | INTRAVENOUS | Status: DC
  Administered 2018-07-06 – 2018-07-07 (×3): 3 10*6.[IU] via INTRAVENOUS
  Filled 2018-07-06 (×3): qty 100

## 2018-07-06 MED ORDER — LIDOCAINE HCL (PF) 1 % IJ SOLN
INTRAMUSCULAR | Status: DC | PRN
  Administered 2018-07-06: 8 mL via EPIDURAL

## 2018-07-06 MED ORDER — TERBUTALINE SULFATE 1 MG/ML IJ SOLN: 0.2500 mg | Freq: Once | INTRAMUSCULAR | Status: DC | PRN

## 2018-07-06 MED ORDER — EPHEDRINE 5 MG/ML INJ
10.0000 mg | INTRAVENOUS | Status: DC | PRN
  Filled 2018-07-06: qty 2

## 2018-07-06 MED ORDER — FENTANYL 2.5 MCG/ML BUPIVACAINE 1/10 % EPIDURAL INFUSION (WH - ANES)
INTRAMUSCULAR | Status: AC
  Filled 2018-07-06: qty 100

## 2018-07-06 MED ORDER — ACETAMINOPHEN 325 MG PO TABS: 650.0000 mg | ORAL_TABLET | ORAL | Status: DC | PRN

## 2018-07-06 MED ORDER — FENTANYL CITRATE (PF) 100 MCG/2ML IJ SOLN
50.0000 ug | INTRAMUSCULAR | Status: DC | PRN
  Administered 2018-07-06 (×2): 100 ug via INTRAVENOUS
  Administered 2018-07-06: 50 ug via INTRAVENOUS
  Filled 2018-07-06 (×3): qty 2

## 2018-07-06 MED ORDER — ONDANSETRON HCL 4 MG/2ML IJ SOLN
4.0000 mg | Freq: Four times a day (QID) | INTRAMUSCULAR | Status: DC | PRN
  Administered 2018-07-07: 4 mg via INTRAVENOUS
  Filled 2018-07-06: qty 2

## 2018-07-06 MED ORDER — OXYTOCIN 40 UNITS IN LACTATED RINGERS INFUSION - SIMPLE MED
1.0000 m[IU]/min | INTRAVENOUS | Status: DC
  Administered 2018-07-06: 2 m[IU]/min via INTRAVENOUS

## 2018-07-06 MED ORDER — FENTANYL CITRATE (PF) 100 MCG/2ML IJ SOLN
INTRAMUSCULAR | Status: DC | PRN
  Administered 2018-07-06: 100 ug via EPIDURAL

## 2018-07-06 MED ORDER — LIDOCAINE-EPINEPHRINE (PF) 2 %-1:200000 IJ SOLN
INTRAMUSCULAR | Status: DC | PRN
  Administered 2018-07-06: 8 mL via EPIDURAL

## 2018-07-06 MED ORDER — LACTATED RINGERS IV SOLN: 500.0000 mL | INTRAVENOUS | Status: DC | PRN

## 2018-07-06 MED ORDER — OXYCODONE-ACETAMINOPHEN 5-325 MG PO TABS: 2.0000 | ORAL_TABLET | ORAL | Status: DC | PRN

## 2018-07-06 MED ORDER — DIPHENHYDRAMINE HCL 50 MG/ML IJ SOLN: 12.5000 mg | INTRAMUSCULAR | Status: DC | PRN

## 2018-07-06 MED ORDER — OXYCODONE-ACETAMINOPHEN 5-325 MG PO TABS: 1.0000 | ORAL_TABLET | ORAL | Status: DC | PRN

## 2018-07-06 MED ORDER — TERBUTALINE SULFATE 1 MG/ML IJ SOLN
0.2500 mg | Freq: Once | INTRAMUSCULAR | Status: DC | PRN
  Filled 2018-07-06: qty 1

## 2018-07-06 MED ORDER — FENTANYL 2.5 MCG/ML BUPIVACAINE 1/10 % EPIDURAL INFUSION (WH - ANES)
14.0000 mL/h | INTRAMUSCULAR | Status: DC | PRN
  Administered 2018-07-06 – 2018-07-07 (×2): 14 mL/h via EPIDURAL
  Filled 2018-07-06: qty 100

## 2018-07-06 MED ORDER — LACTATED RINGERS IV SOLN
500.0000 mL | Freq: Once | INTRAVENOUS | Status: AC
  Administered 2018-07-06: 500 mL via INTRAVENOUS

## 2018-07-06 MED ORDER — LIDOCAINE HCL (PF) 1 % IJ SOLN
30.0000 mL | INTRAMUSCULAR | Status: DC | PRN
  Filled 2018-07-06: qty 30

## 2018-07-06 MED ORDER — SOD CITRATE-CITRIC ACID 500-334 MG/5ML PO SOLN: 30.0000 mL | ORAL | Status: DC | PRN

## 2018-07-06 MED ORDER — SODIUM CHLORIDE 0.9 % IV SOLN
5.0000 10*6.[IU] | Freq: Once | INTRAVENOUS | Status: AC
  Administered 2018-07-06: 5 10*6.[IU] via INTRAVENOUS
  Filled 2018-07-06: qty 5

## 2018-07-06 MED ORDER — OXYTOCIN 40 UNITS IN LACTATED RINGERS INFUSION - SIMPLE MED
2.5000 [IU]/h | INTRAVENOUS | Status: DC
  Filled 2018-07-06: qty 1000

## 2018-07-06 NOTE — H&P (Signed)
27 y.o. G2P0010 @ 5628w1d present to the office with c/o leakage of fluid since yesterday at 2100.  On exam, she had +pool and + fern.  She denies contractions, has good fetal movement and no bleeding.  Past Medical History:  Diagnosis Date  . Allergic rhinitis   . ASCUS (atypical squamous cells of undetermined significance) on Pap smear 01/08/2012   hpv pos,follow up pap 09-21-12 Ascus, pap 05-20-14 Neg  . Chlamydia 2010 and 10/06/15  . History of chicken pox   . Osgood-Schlatter's disease    hx    Past Surgical History:  Procedure Laterality Date  . NO PAST SURGERIES      OB History  Gravida Para Term Preterm AB Living  2 0 0 0 1 0  SAB TAB Ectopic Multiple Live Births  1 0 0 0      # Outcome Date GA Lbr Len/2nd Weight Sex Delivery Anes PTL Lv  2 Current           1 SAB 09/11/17 4138w4d           Social History   Socioeconomic History  . Marital status: Married    Spouse name: Not on file  . Number of children: Not on file  . Years of education: Not on file  . Highest education level: Not on file  Occupational History  . Not on file  Tobacco Use  . Smoking status: Never Smoker  . Smokeless tobacco: Never Used  Substance and Sexual Activity  . Alcohol use: No    Alcohol/week: 0.0 standard drinks  . Drug use: No  . Sexual activity: Yes    Partners: Male    Birth control/protection: None  Lifestyle  . Physical activity:    Days per week: 2 days    Minutes per session: 60 min  . 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   As off because of work schedule.   Negative TAD   Avoids milk    Patient has no known allergies.    Prenatal Transfer Tool  Maternal Diabetes: No Genetic Screening: Normal FTS Maternal Ultrasounds/Referrals: Normal Fetal Ultrasounds or other  Referrals:  None Maternal Substance Abuse:  No Significant Maternal Medications:  None Significant Maternal Lab Results: Lab values include: Group B Strep positive  ABO, Rh: --/--/A POS (11/25 1211) Antibody: NEG (11/25 1211) Rubella:  Immune RPR:   NR HBsAg:   Neg HIV:   Neg GBS:   Pos    Other PNC: complicated by chlamydia and gonorrhea this pregnancy.  Repeat testing x 2 negative.    Vitals:   07/06/18 1352 07/06/18 1512  BP: 112/71 115/73  Pulse: 66 72  Resp: 20   Temp:       General:  NAD Abdomen:  soft, gravid, EFW 5.5-6# Ex:  no edema SVE:  1/50/-3 FHTs:  130s, moderate variability, + accels, Cat 1 Toco:  q10 min   A/P   27 y.o. G2P0010 7528w1d presents with rupture of membranes since yesterday at 2100 Admit to L&D Cytotec for cervical ripening Plans to avoid epidural  FSR/ vtx/ GBS positive--pcn  [facility]AN[facility] Rose Saga Rose

## 2018-07-06 NOTE — Anesthesia Pain Management Evaluation Note (Signed)
  CRNA Pain Management Visit Note  Patient: Christine Rose, 27 y.o., female  "Hello I am a member of the anesthesia team at [facility]. We have an anesthesia team available at all times to provide care throughout the hospital, including epidural management and anesthesia for C-section. I don't know your plan for the delivery whether it a natural birth, water birth, IV sedation, nitrous supplementation, doula or epidural, but we want to meet your pain goals."   1.Was your pain managed to your expectations on prior hospitalizations?   Yes   2.What is your expectation for pain management during this hospitalization?     Maybe IV pain meds  3.How can we help you reach that goal? No Epidura  Record the patient's initial score and the patient's pain goal.   Pain: 0  Pain Goal: 5 [facility] wants you to be able to say your pain was always managed very well.  Ulice BoldDavid Adedayo [facility] 07/06/2018

## 2018-07-06 NOTE — Anesthesia Preprocedure Evaluation (Signed)
Anesthesia Evaluation  Patient identified by MRN, date of birth, ID band Patient awake    Reviewed: Allergy & Precautions, H&P , NPO status , Patient's Chart, lab work & pertinent test results, reviewed documented beta blocker date and time   Airway Mallampati: II  TM Distance: >3 FB Neck ROM: full    Dental no notable dental hx.    Pulmonary neg pulmonary ROS,    Pulmonary exam normal breath sounds clear to auscultation       Cardiovascular negative cardio ROS Normal cardiovascular exam Rhythm:regular Rate:Normal     Neuro/Psych negative neurological ROS  negative psych ROS   GI/Hepatic negative GI ROS, Neg liver ROS,   Endo/Other  negative endocrine ROS  Renal/GU negative Renal ROS  negative genitourinary   Musculoskeletal   Abdominal   Peds  Hematology negative hematology ROS (+)   Anesthesia Other Findings   Reproductive/Obstetrics (+) Pregnancy                             Anesthesia Physical Anesthesia Plan  ASA: II  Anesthesia Plan: Epidural   Post-op Pain Management:    Induction:   PONV Risk Score and Plan: 2  Airway Management Planned:   Additional Equipment:   Intra-op Plan:   Post-operative Plan:   Informed Consent: I have reviewed the patients History and Physical, chart, labs and discussed the procedure including the risks, benefits and alternatives for the proposed anesthesia with the patient or authorized representative who has indicated his/her understanding and acceptance.   Dental Advisory Given  Plan Discussed with: CRNA, Anesthesiologist and Surgeon  Anesthesia Plan Comments: (Labs checked- platelets confirmed with RN in room. Fetal heart tracing, per RN, reported to be stable enough for sitting procedure. Discussed epidural, and patient consents to the procedure:  included risk of possible headache,backache, failed block, allergic reaction, and nerve  injury. This patient was asked if she had any questions or concerns before the procedure started.)        Anesthesia Quick Evaluation  

## 2018-07-06 NOTE — Anesthesia Procedure Notes (Signed)
Epidural Patient location during procedure: OB Start time: 07/06/2018 10:17 PM End time: 07/06/2018 10:20 PM  Staffing Anesthesiologist: Bethena Midgetddono, Kyzen Horn, MD  Preanesthetic Checklist Completed: patient identified, site marked, surgical consent, pre-op evaluation, timeout performed, IV checked, risks and benefits discussed and monitors and equipment checked  Epidural Patient position: sitting Prep: site prepped and draped and DuraPrep Patient monitoring: continuous pulse ox and blood pressure Approach: midline Location: L3-L4 Injection technique: LOR air  Needle:  Needle type: Tuohy  Needle gauge: 17 G Needle length: 9 cm and 9 Needle insertion depth: 8 cm Catheter type: closed end flexible Catheter size: 19 Gauge Catheter at skin depth: 13 cm Test dose: negative  Assessment Events: blood not aspirated, injection not painful, no injection resistance, negative IV test and no paresthesia

## 2018-07-07 ENCOUNTER — Encounter (HOSPITAL_COMMUNITY): Payer: Self-pay | Admitting: *Deleted

## 2018-07-07 LAB — CBC
HCT: 32.5 % — ABNORMAL LOW (ref 36.0–46.0)
Hemoglobin: 10.4 g/dL — ABNORMAL LOW (ref 12.0–15.0)
MCH: 27.4 pg (ref 26.0–34.0)
MCHC: 32 g/dL (ref 30.0–36.0)
MCV: 85.8 fL (ref 80.0–100.0)
NRBC: 0 % (ref 0.0–0.2)
PLATELETS: 227 10*3/uL (ref 150–400)
RBC: 3.79 MIL/uL — AB (ref 3.87–5.11)
RDW: 14 % (ref 11.5–15.5)
WBC: 15.1 10*3/uL — AB (ref 4.0–10.5)

## 2018-07-07 LAB — RPR: RPR: NONREACTIVE

## 2018-07-07 MED ORDER — COCONUT OIL OIL
1.0000 "application " | TOPICAL_OIL | Status: DC | PRN
  Administered 2018-07-07: 1 via TOPICAL
  Filled 2018-07-07: qty 120

## 2018-07-07 MED ORDER — PRENATAL MULTIVITAMIN CH
1.0000 | ORAL_TABLET | Freq: Every day | ORAL | Status: DC
  Administered 2018-07-07 – 2018-07-08 (×2): 1 via ORAL
  Filled 2018-07-07 (×2): qty 1

## 2018-07-07 MED ORDER — SIMETHICONE 80 MG PO CHEW: 80.0000 mg | CHEWABLE_TABLET | ORAL | Status: DC | PRN

## 2018-07-07 MED ORDER — ONDANSETRON HCL 4 MG/2ML IJ SOLN: 4.0000 mg | INTRAMUSCULAR | Status: DC | PRN

## 2018-07-07 MED ORDER — SENNOSIDES-DOCUSATE SODIUM 8.6-50 MG PO TABS
2.0000 | ORAL_TABLET | ORAL | Status: DC
  Administered 2018-07-07: 2 via ORAL
  Filled 2018-07-07: qty 2

## 2018-07-07 MED ORDER — TETANUS-DIPHTH-ACELL PERTUSSIS 5-2.5-18.5 LF-MCG/0.5 IM SUSP: 0.5000 mL | Freq: Once | INTRAMUSCULAR | Status: DC

## 2018-07-07 MED ORDER — OXYCODONE HCL 5 MG PO TABS: 10.0000 mg | ORAL_TABLET | ORAL | Status: DC | PRN

## 2018-07-07 MED ORDER — OXYCODONE HCL 5 MG PO TABS: 5.0000 mg | ORAL_TABLET | ORAL | Status: DC | PRN

## 2018-07-07 MED ORDER — BENZOCAINE-MENTHOL 20-0.5 % EX AERO
1.0000 "application " | INHALATION_SPRAY | CUTANEOUS | Status: DC | PRN
  Administered 2018-07-07: 1 via TOPICAL
  Filled 2018-07-07: qty 56

## 2018-07-07 MED ORDER — DIBUCAINE 1 % RE OINT: 1.0000 "application " | TOPICAL_OINTMENT | RECTAL | Status: DC | PRN

## 2018-07-07 MED ORDER — ACETAMINOPHEN 325 MG PO TABS: 650.0000 mg | ORAL_TABLET | ORAL | Status: DC | PRN

## 2018-07-07 MED ORDER — IBUPROFEN 600 MG PO TABS
600.0000 mg | ORAL_TABLET | Freq: Four times a day (QID) | ORAL | Status: DC
  Administered 2018-07-07 – 2018-07-08 (×6): 600 mg via ORAL
  Filled 2018-07-07 (×6): qty 1

## 2018-07-07 MED ORDER — DIPHENHYDRAMINE HCL 25 MG PO CAPS: 25.0000 mg | ORAL_CAPSULE | Freq: Four times a day (QID) | ORAL | Status: DC | PRN

## 2018-07-07 MED ORDER — ONDANSETRON HCL 4 MG PO TABS: 4.0000 mg | ORAL_TABLET | ORAL | Status: DC | PRN

## 2018-07-07 MED ORDER — WITCH HAZEL-GLYCERIN EX PADS: 1.0000 "application " | MEDICATED_PAD | CUTANEOUS | Status: DC | PRN

## 2018-07-07 NOTE — Lactation Note (Signed)
This note was copied from a baby's chart. Lactation Consultation Note:  P1, infant is 38.2 weeks . Mother reports that infant has just finished her fourth feeding. Staff nurse scored a latch of 8.  Mother reports hearing infant swallow. Infant remains STS with mother , nuzzled close to breast.   Reviewed hand expression and reports seeing colostrum.  Reviewed basic breastfeeding teaching. [place] [facility]. [facility] Discussed cluster feeding, cue base feeding and advised to breastfeed infant 8-12 times or more in 24 hours. Mother reports that staff nurse assist with latching infant and she denies having any nipple discomfort with feeding.  Advised mother to page for [facility] to see next feeding .  Mother was given [facility] broch[facility] with information on [facility] and community support.   Patient Name: Christine Rose Today's Date: 07/07/2018 Reason for consult: Initial assessment   Maternal Data Has patient been taught Hand Expression?: Yes Does the patient have breastfeeding experience prior to this delivery?: No  Feeding Feeding Type: Breast Fed  LATCH Score Latch: Grasps breast easily, tongue down, lips flanged, rhythmical sucking.  Audible Swallowing: A few with stimulation  Type of Nipple: Everted at rest and after stimulation  Comfort (Breast/Nipple): Soft / non-tender  Hold (Positioning): Assistance needed to correctly position infant at breast and maintain latch.  LATCH Score: 8  Interventions Interventions: Breast feeding basics reviewed;Skin to skin;Position options;Expressed milk  Lactation Tools Discussed/Used     Consult Status Consult Status: Follow-up Date: 07/08/18 Follow-up type: In-patient    Stevan BornKendrick, Gabriela Giannelli [facility] 07/07/2018, 1:30 PM

## 2018-07-07 NOTE — Anesthesia Postprocedure Evaluation (Signed)
Anesthesia Post Note  Patient: Christine Rose  Procedure(s) Performed: AN AD HOC LABOR EPIDURAL     Patient location during evaluation: Mother Baby Anesthesia Type: Epidural Level of consciousness: awake, awake and alert and oriented Pain management: pain level controlled Vital Signs Assessment: post-procedure vital signs reviewed and stable Respiratory status: spontaneous breathing, nonlabored ventilation and respiratory function stable Cardiovascular status: stable Postop Assessment: patient able to bend at knees, no headache, no backache, no apparent nausea or vomiting and adequate PO intake Anesthetic complications: no    Last Vitals:  Vitals:   07/07/18 0600 07/07/18 0650  BP: (!) 116/48 (!) 107/58  Pulse: 66 74  Resp: 20 18  Temp: 36.9 C 36.5 C  SpO2: 99% 97%    Last Pain:  Vitals:   07/07/18 0650  TempSrc: Oral  PainSc: 2    Pain Goal:                 [name]

## 2018-07-07 NOTE — Anesthesia Postprocedure Evaluation (Signed)
Anesthesia Post Note  Patient: Christine Rose  Procedure(s) Performed: AN AD HOC LABOR EPIDURAL     Patient location during evaluation: Mother Baby Anesthesia Type: Epidural Level of consciousness: awake and alert Pain management: pain level controlled Vital Signs Assessment: post-procedure vital signs reviewed and stable Respiratory status: spontaneous breathing, nonlabored ventilation and respiratory function stable Cardiovascular status: stable Postop Assessment: no headache, no backache and epidural receding Anesthetic complications: no    Last Vitals:  Vitals:   07/07/18 0516 07/07/18 0600  BP: 118/60 (!) 116/48  Pulse: 72 66  Resp: 18 20  Temp:  36.9 C  SpO2:  99%    Last Pain:  Vitals:   07/07/18 0600  TempSrc: Oral  PainSc: 2    Pain Goal:                 Vern Guerette

## 2018-07-07 NOTE — Addendum Note (Signed)
Addendum  created 07/07/18 0855 by Sahmir Weatherbee, Doree Fudgeolleen S, CRNA   Charge Capture section accepted, Sign clinical note

## 2018-07-08 NOTE — Discharge Summary (Signed)
Obstetric Discharge Summary Reason for Admission: rupture of membranes Prenatal Procedures: ultrasound Intrapartum Procedures: spontaneous vaginal delivery Postpartum Procedures: none Complications-Operative and Postpartum: none Hemoglobin  Date Value Ref Range Status  07/07/2018 10.4 (L) 12.0 - 15.0 g/dL Final   HCT  Date Value Ref Range Status  07/07/2018 32.5 (L) 36.0 - 46.0 % Final    Physical Exam:  General: alert and cooperative Lochia: appropriate Uterine Fundus: firm DVT Evaluation: No evidence of DVT seen on physical exam.  Discharge Diagnoses: Term Pregnancy-delivered  Discharge Information: Date: 07/08/2018 Activity: pelvic rest Diet: routine Medications: PNV and Ibuprofen Condition: stable Instructions: refer to practice specific booklet Discharge to: home Follow-up Information    Marlow Baarslark, Dyanna, MD Follow up in 4 week(s).   Specialty:  Obstetrics Contact information: [address] [place] [id number] [phone]           Newborn Data: Live born female  Birth Weight: 6 lb 9.5 oz (2991 g) APGAR: 8, 9  Newborn Delivery   Time head delivered:  07/07/2018 03:59:00 Birth date/time:  07/07/2018 04:00:00 Delivery type:  Vaginal, Spontaneous     Home with mother.  Philip AspenCALLAHAN, Shawana Knoch 07/08/2018, 3:50 PM

## 2018-07-08 NOTE — Progress Notes (Addendum)
Patient is eating, ambulating, voiding.  Pain control is good.  Appropriate lochia.  Vitals:   07/07/18 1432 07/07/18 1759 07/07/18 2142 07/08/18 0510  BP: 102/64 110/69 101/67 107/68  Pulse: 62 (!) 56 64 (!) 59  Resp:  18 16 18   Temp: 98.2 F (36.8 C) 98.3 F (36.8 C) 99.5 F (37.5 C) 97.7 F (36.5 C)  TempSrc: Oral Oral Oral Oral  SpO2:   96% 100%  Weight:      Height:        Fundus firm Abd soft, NT Ext; no calf tenderness  Lab Results  Component Value Date   WBC 15.1 (H) 07/07/2018   HGB 10.4 (L) 07/07/2018   HCT 32.5 (L) 07/07/2018   MCV 85.8 07/07/2018   PLT 227 07/07/2018    --/--/A POS (11/25 1211)  A/P Post partum day 1 Doing well, desires d/c today  Routine care    [place], Traveion Ruddock

## 2018-07-08 NOTE — Lactation Note (Signed)
This note was copied from a baby's chart. Lactation Consultation Note  Patient Name: [name] [id number]'s Date: 07/08/2018 Reason for consult: Follow-up assessment;Early term 37-38.6wks;1st time breastfeeding;Primapara   Follow up with mom of 29 hour old infant. Infant with 7 BF for 10-90 minutes, 3 voids and 4 stools in the last 24 hours. Infant weight 6 pounds 4 ounces with 5% weight loss since birth. LATCH scores 8-9.   Mom reports her breasts are feeling fuller today. She reports she is able to express colostrum easily. Mom reports she was having some discomfort that she has been able to resolve with better latching. Mom is using EBM and Coconut oil to nipples as needed. Nipple tissue intact.   Assisted mom with latching infant to the left breast in the cross cradle hold. Infant latched easily. Mom reports infant is sleepy at the breast, reviewed feeding STS, stimulation to infant with feeding and massage/compression of the breast with feeding to maximize milk transfer. Infant responds well to stimulation and was feeding actively when [facility].   Enc mom to feed infant with feeding cues and to offer both breasts with each feeding. Reviewed I/O, signs of dehydration in the infant, signs infant is getting enough, positioning, pillow and head support, hand expression, using EBM to supplement infant if needed, engorgement prevention/treatment and breast milk expression and storage. Mom has Spectra S1 pump and First Years Pump at home for use.   Mom is working on making follow up Ped appt for infant. [facility] Bro[facility] given, mom informed of OP services, BF Support Groups and LC phone #. Mom has no further questions/concerns at this time. Mom to call with any questions as needed.    Maternal Data Formula Feeding for Exclusion: No Has patient been taught Hand Expression?: Yes Does the patient have breastfeeding experience prior to this delivery?: No  Feeding Feeding Type: Breast  Fed  LATCH Score Latch: Repeated attempts needed to sustain latch, nipple held in mouth throughout feeding, stimulation needed to elicit sucking reflex.  Audible Swallowing: A few with stimulation  Type of Nipple: Everted at rest and after stimulation  Comfort (Breast/Nipple): Soft / non-tender  Hold (Positioning): Assistance needed to correctly position infant at breast and maintain latch.  LATCH Score: 7  Interventions Interventions: Breast feeding basics reviewed;Support pillows;Assisted with latch;Position options;Skin to skin;Breast massage;Breast compression;Hand express;Coconut oil;Expressed milk  Lactation Tools Discussed/Used Tools: Coconut oil Pump Review: Milk Storage   Consult Status Consult Status: Complete Follow-up type: Call as needed    Christine BlalockSharon S Channelle Rose 07/08/2018, 9:16 AM

## 2018-08-10 IMAGING — US US MFM OB LIMITED
1 series · 15 of 21 positions shown · non-contrast
Comparison: none

[Series 1: us mfm ob limited · 15 of 21 slices shown]
[im 1/21]
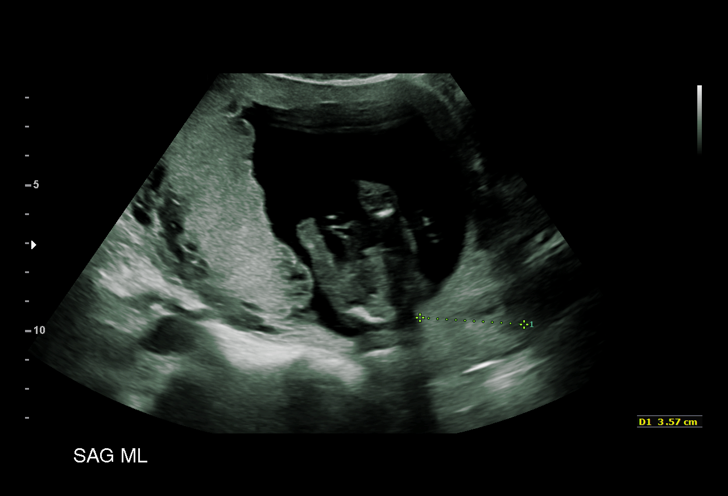
[im 3/21]
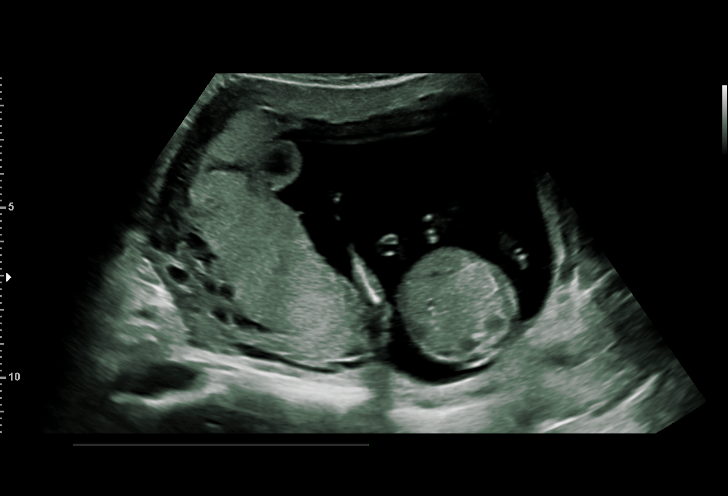
[im 4/21]
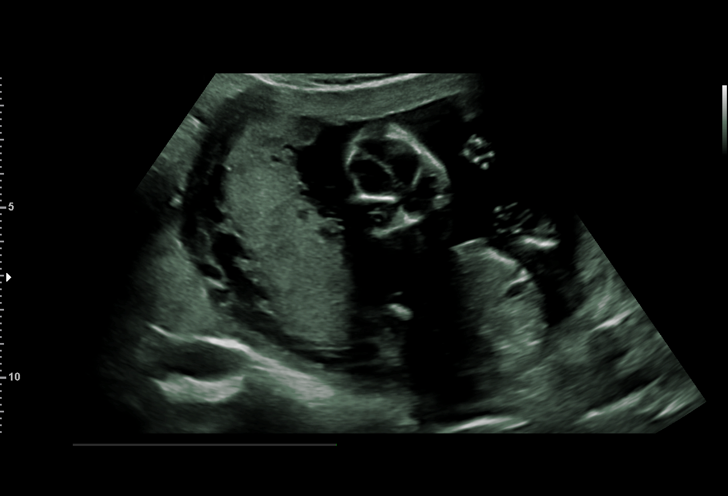
[im 5/21]
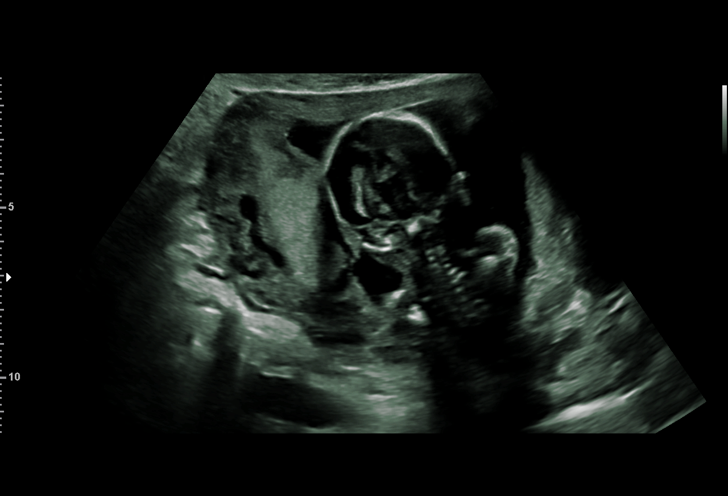
[im 7/21]
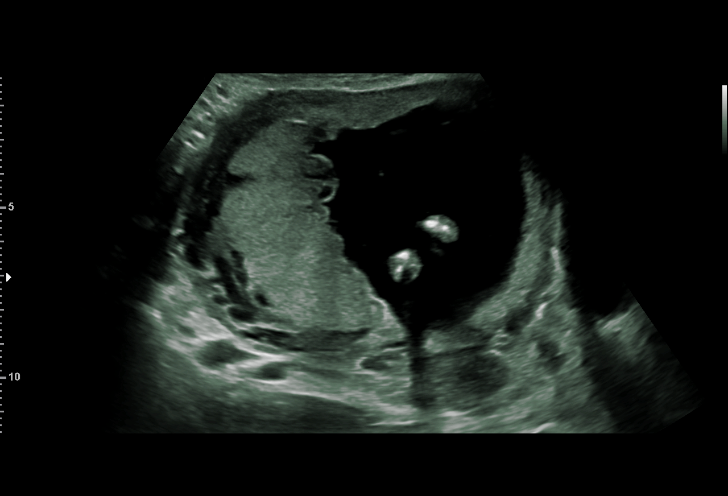
[im 8/21]
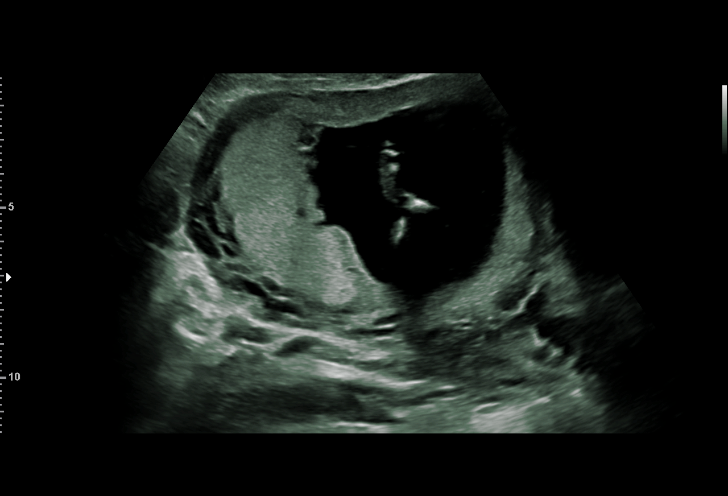
[im 10/21]
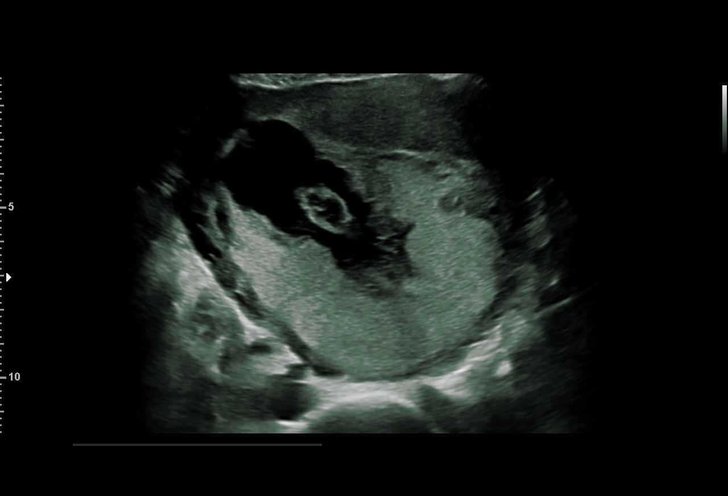
[im 11/21]
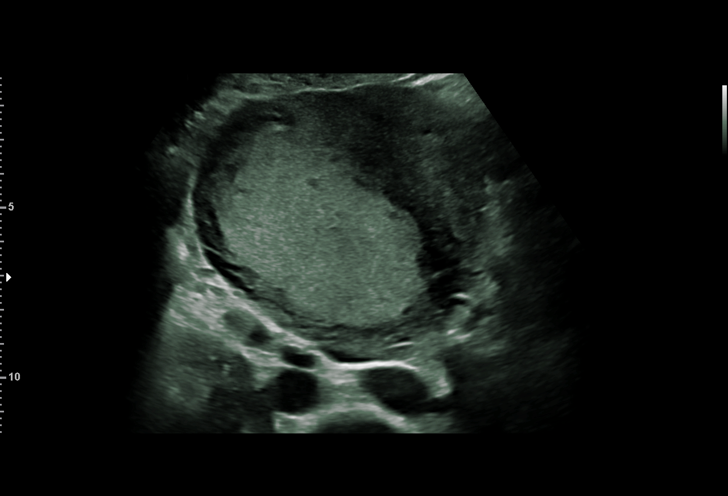
[im 12/21]
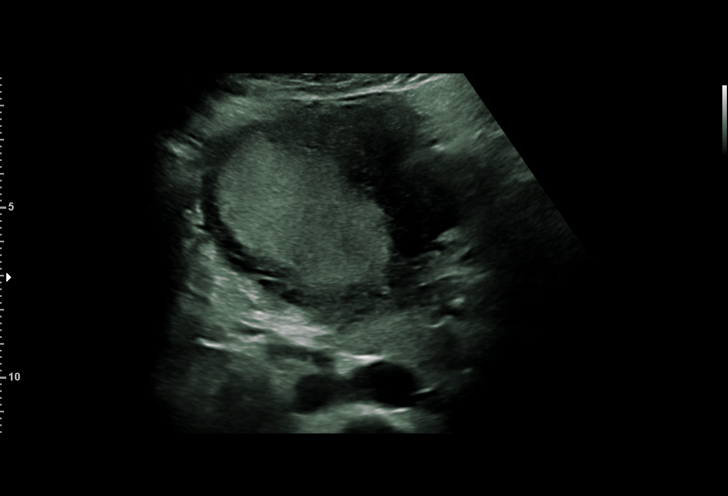
[im 14/21]
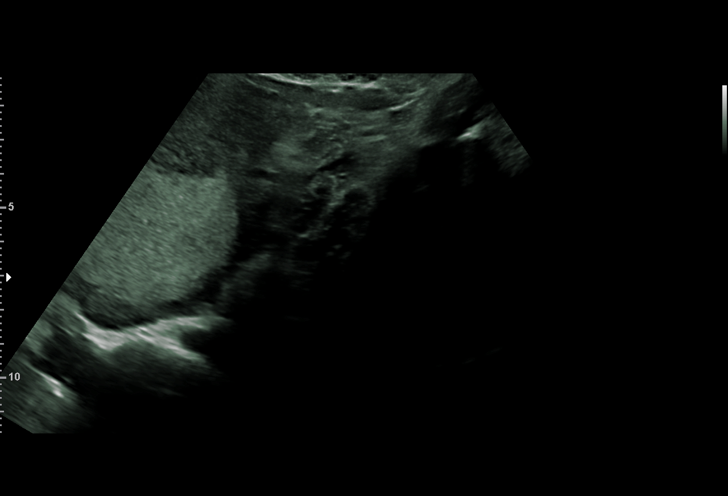
[im 15/21]
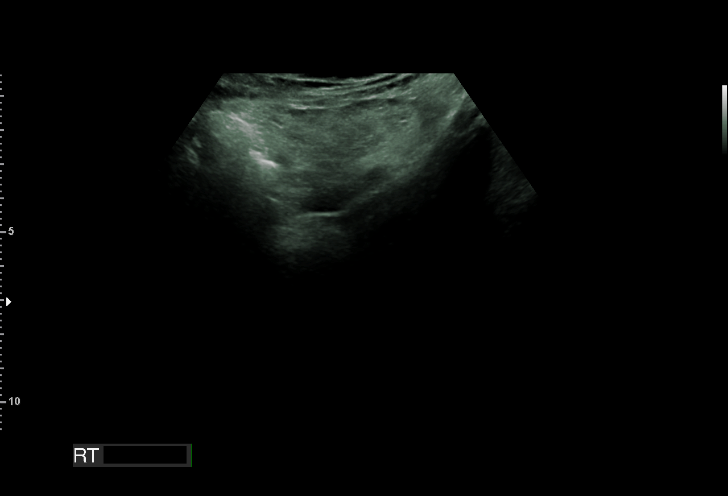
[im 17/21]
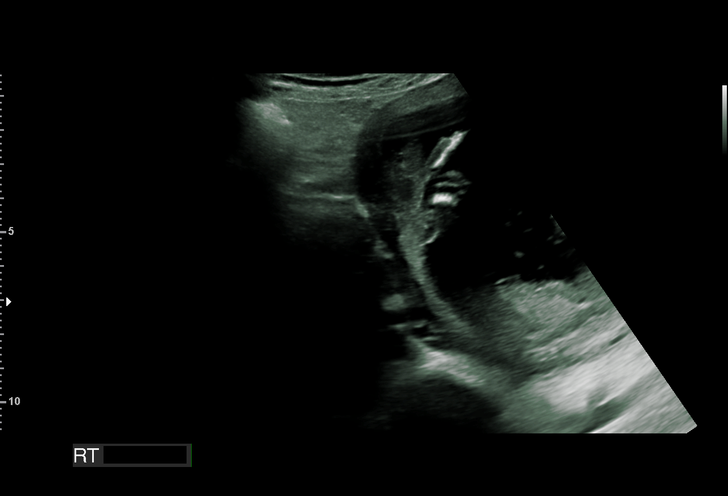
[im 18/21]
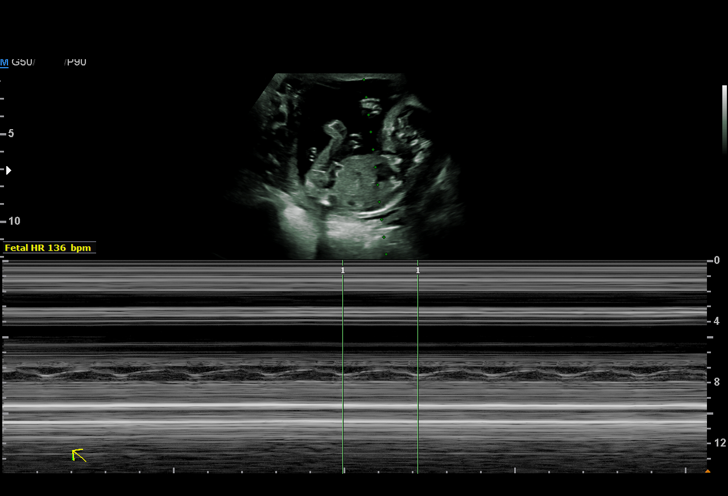
[im 19/21]
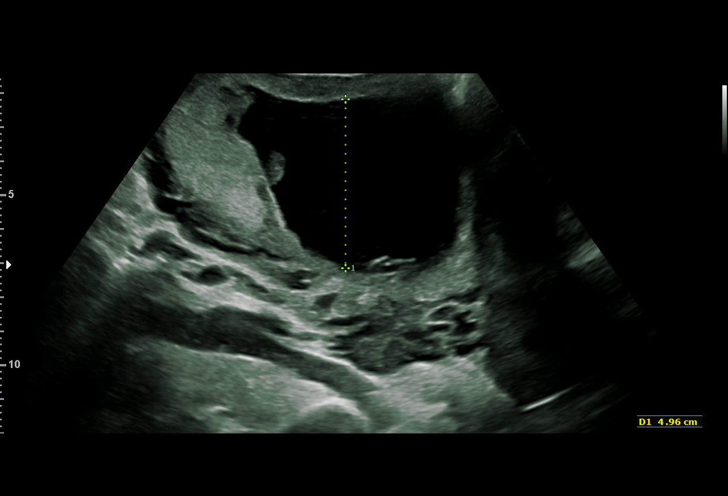
[im 21/21]
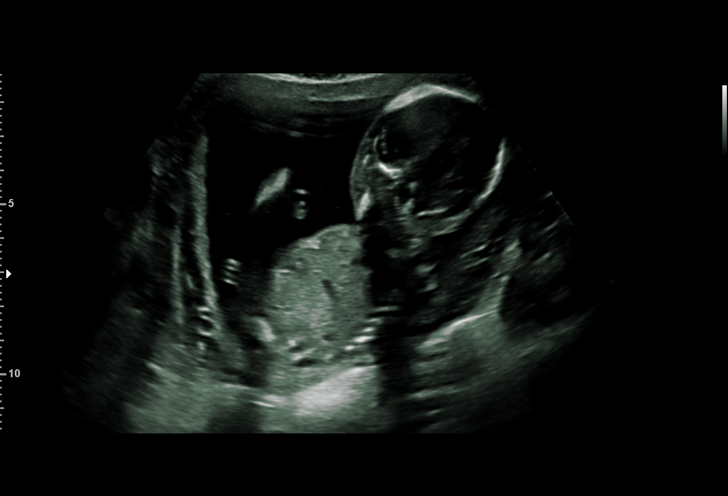

[15 of 21 positions shown; findings below may reference images not displayed]

1  OURARI TIGER          [id number]      [id number]     [id number]
Indications

17 weeks gestation of pregnancy
Traumatic injury during pregnancy - fall
OB History

Gravidity:    2          SAB:   1
Living:       0
Fetal Evaluation

Num Of Fetuses:     1
Fetal Heart         136
Rate(bpm):
Cardiac Activity:   Observed
Presentation:       Breech
Placenta:           Posterior, above cervical os

Amniotic Fluid
AFI FV:      Subjectively within normal limits

Largest Pocket(cm)
4.96

Comment:    No placental abruption  identified.
Gestational Age

Clinical EDD:  17w 2d                                        EDD:   07/19/18
Best:          17w 2d     Det. By:  Clinical EDD             EDD:   07/19/18
Cervix Uterus Adnexa

Cervix
Length:            3.5  cm.
Normal appearance by transabdominal scan.
Left Ovary
Not visualized.

Right Ovary
Not visualized.
Impression

This study was remotely read.

History of fall.
On ultrasound, amniotic fluid is normal and good fetal activity
is seen. Placenta looks normal and there is no evidence of
separation of placenta.

## 2019-01-28 LAB — HM PAP SMEAR

## 2019-03-01 ENCOUNTER — Encounter: Payer: Self-pay | Admitting: Internal Medicine

## 2019-03-08 ENCOUNTER — Other Ambulatory Visit: Payer: Self-pay

## 2019-03-08 DIAGNOSIS — Z20822 Contact with and (suspected) exposure to covid-19: Secondary | ICD-10-CM

## 2019-03-09 LAB — NOVEL CORONAVIRUS, NAA: SARS-CoV-2, NAA: DETECTED — AB

## 2019-12-12 ENCOUNTER — Encounter (HOSPITAL_COMMUNITY): Payer: Self-pay | Admitting: Emergency Medicine

## 2019-12-12 ENCOUNTER — Emergency Department (HOSPITAL_COMMUNITY)
Admission: EM | Admit: 2019-12-12 | Discharge: 2019-12-12 | Disposition: A | Discharge status: Home / Self Care | Payer: Medicaid Other | Attending: Emergency Medicine | Admitting: Emergency Medicine

## 2019-12-12 ENCOUNTER — Other Ambulatory Visit: Payer: Self-pay

## 2019-12-12 DIAGNOSIS — Z20822 Contact with and (suspected) exposure to covid-19: Secondary | ICD-10-CM | POA: Insufficient documentation

## 2019-12-12 DIAGNOSIS — R509 Fever, unspecified: Secondary | ICD-10-CM | POA: Insufficient documentation

## 2019-12-12 DIAGNOSIS — R197 Diarrhea, unspecified: Secondary | ICD-10-CM | POA: Insufficient documentation

## 2019-12-12 DIAGNOSIS — R519 Headache, unspecified: Secondary | ICD-10-CM | POA: Insufficient documentation

## 2019-12-12 LAB — POC SARS CORONAVIRUS 2 AG -  ED: SARS Coronavirus 2 Ag: NEGATIVE

## 2019-12-12 MED ORDER — DEXAMETHASONE 4 MG PO TABS
10.0000 mg | ORAL_TABLET | Freq: Once | ORAL | Status: AC
  Administered 2019-12-12: 10 mg via ORAL
  Filled 2019-12-12: qty 2

## 2019-12-12 MED ORDER — DIPHENHYDRAMINE HCL 50 MG/ML IJ SOLN
25.0000 mg | Freq: Once | INTRAMUSCULAR | Status: AC
  Administered 2019-12-12: 25 mg via INTRAMUSCULAR
  Filled 2019-12-12: qty 1

## 2019-12-12 MED ORDER — PROCHLORPERAZINE EDISYLATE 10 MG/2ML IJ SOLN
10.0000 mg | Freq: Once | INTRAMUSCULAR | Status: AC
  Administered 2019-12-12: 19:00:00 10 mg via INTRAMUSCULAR
  Filled 2019-12-12: qty 2

## 2019-12-12 NOTE — ED Provider Notes (Signed)
[facility] DEPT Provider Note   CSN: [id number] Arrival date & time: 12/12/19  1757     History Chief Complaint  Patient presents with  . Headache  . Fever    Christine Rose is a 29 y.o. female.  29 yo F with a chief complaints of headache.  Going on for about 4 days now.  Feels like her typical migraines but is not resolving.  Patient noticed today that she had a fever and has had a few loose stools.  Nonbloody nondark.  Has had some mild abdominal discomfort that she describes as her normal abdominal discomfort just prior to her cycle.  She denies vaginal bleeding or discharge.  Denies urinary symptoms.  Denies cough or congestion.  States that headache feels as it normally does starts in the front and radiates to the back.  Nothing seems to make it worse.  Usually improves with sleep.  She has been sleeping but usually has reoccurred.  Denies suspicious food intake.  Denies recent travel.  The history is provided by the patient.  Headache Associated symptoms: abdominal pain, diarrhea and fever   Associated symptoms: no congestion, no dizziness, no myalgias, no nausea and no vomiting   Fever Associated symptoms: diarrhea and headaches   Associated symptoms: no chest pain, no chills, no congestion, no dysuria, no myalgias, no nausea, no rhinorrhea and no vomiting   Illness Severity:  Moderate Onset quality:  Gradual Duration:  2 days Timing:  Constant Progression:  Worsening Chronicity:  New Associated symptoms: abdominal pain, diarrhea, fever and headaches   Associated symptoms: no chest pain, no congestion, no myalgias, no nausea, no rhinorrhea, no shortness of breath, no vomiting and no wheezing        Past Medical History:  Diagnosis Date  . Allergic rhinitis   . ASCUS (atypical squamous cells of undetermined significance) on Pap smear 01/08/2012   hpv pos,follow up pap 09-21-12 Ascus, pap 05-20-14 Neg  . Chlamydia 2010 and 10/06/15  .  History of chicken pox   . Osgood-Schlatter's disease    hx    Patient Active Problem List   Diagnosis Date Noted  . PROM (premature rupture of membranes) 07/06/2018  . Miscarriage 09/27/2017  . Right upper quadrant pain 05/20/2014  . Acute right lower quadrant pain 05/20/2014  . Other specified fever 05/20/2014  . Abnormal Pap smear 07/21/2012  . ASCUS (atypical squamous cells of undetermined significance) on Pap smear 01/08/2012  . Routine gynecological examination 12/27/2011  . Surveillance for medroxyprogesterone contraception 12/27/2011  . Contraceptive management 03/04/2011  . Encounter for surveillance of injectable contraceptive 03/04/2011  . Allergic rhinitis   . Screening for STD (sexually transmitted disease) 12/26/2010  . Preventative health care 12/26/2010  . CHEST PAIN, ACUTE 05/25/2010  . ALLERGIC RHINITIS 12/23/2009  . GERD 12/23/2009  . HAND PAIN, LEFT 02/28/2009  . BACK PAIN 06/22/2008  . UNSPECIFIED DISORDER OF SKIN&SUBCUTANEOUS TISSUE 06/29/2007  . EXCESSIVE MENSTRUATION 06/04/2007    Past Surgical History:  Procedure Laterality Date  . NO PAST SURGERIES       OB History    Gravida  2   Para  1   Term  1   Preterm  0   AB  1   Living  1     SAB  1   TAB  0   Ectopic  0   Multiple  0   Live Births  1  Family History  Problem Relation Age of Onset  . Other Mother        large ASD declining surgical intervention  . Stroke Mother 36  . Breast cancer Maternal Grandmother 34  . Diabetes Maternal Grandmother   . Hypertension Maternal Grandmother   . Diabetes Maternal Grandfather   . Hypertension Maternal Grandfather   . Diabetes Paternal Grandmother   . Hypertension Paternal Grandmother   . Diabetes Paternal Grandfather   . Hypertension Paternal Grandfather     Social History   Tobacco Use  . Smoking status: Never Smoker  . Smokeless tobacco: Never Used  Substance Use Topics  . Alcohol use: No     Alcohol/week: 0.0 standard drinks  . Drug use: No    Home Medications Prior to Admission medications   Not on File    Allergies    Patient has no known allergies.  Review of Systems   Review of Systems  Constitutional: Positive for fever. Negative for chills.  HENT: Negative for congestion and rhinorrhea.   Eyes: Negative for redness and visual disturbance.  Respiratory: Negative for shortness of breath and wheezing.   Cardiovascular: Negative for chest pain and palpitations.  Gastrointestinal: Positive for abdominal pain and diarrhea. Negative for nausea and vomiting.  Genitourinary: Negative for dysuria and urgency.  Musculoskeletal: Negative for arthralgias and myalgias.  Skin: Negative for pallor and wound.  Neurological: Positive for headaches. Negative for dizziness.    Physical Exam Updated Vital Signs BP 117/80   Pulse 97   Temp (!) 101.3 F (38.5 C) (Oral)   Resp 16   LMP 12/10/2019   SpO2 98%   Physical Exam Vitals and nursing note reviewed.  Constitutional:      General: She is not in acute distress.    Appearance: She is well-developed. She is not diaphoretic.  HENT:     Head: Normocephalic and atraumatic.     Comments: No neck stiffness or rigidity.  No sinus tenderness to percussion.  Small effusion to bilateral TMs.  Posterior pharynx without erythema edema.  Tongue secretions no difficulty. Eyes:     Pupils: Pupils are equal, round, and reactive to light.  Cardiovascular:     Rate and Rhythm: Normal rate and regular rhythm.     Heart sounds: No murmur. No friction rub. No gallop.   Pulmonary:     Effort: Pulmonary effort is normal.     Breath sounds: No wheezing or rales.  Abdominal:     General: There is no distension.     Palpations: Abdomen is soft.     Tenderness: There is no abdominal tenderness.  Musculoskeletal:        General: No tenderness.     Cervical back: Normal range of motion and neck supple.  Skin:    General: Skin is warm  and dry.  Neurological:     Mental Status: She is alert and oriented to person, place, and time.     Cranial Nerves: Cranial nerves are intact.     Sensory: Sensation is intact.     Motor: Motor function is intact.     Coordination: Coordination is intact.     Gait: Gait is intact.     Comments: Benign neurologic exam.  Psychiatric:        Behavior: Behavior normal.     ED Results / Procedures / Treatments   Labs (all labs ordered are listed, but only abnormal results are displayed) Labs Reviewed  RESPIRATORY PANEL BY RT PCR (  FLU A&B, COVID)  POC SARS CORONAVIRUS 2 AG -  ED    EKG None  Radiology No results found.  Procedures Procedures (including critical care time)  Medications Ordered in ED Medications  prochlorperazine (COMPAZINE) injection 10 mg (10 mg Intramuscular Given 12/12/19 1832)  diphenhydrAMINE (BENADRYL) injection 25 mg (25 mg Intramuscular Given 12/12/19 1831)  dexamethasone (DECADRON) tablet 10 mg (10 mg Oral Given 12/12/19 1833)    ED Course  I have reviewed the triage vital signs and the nursing notes.  Pertinent labs & imaging results that were available during my care of the patient were reviewed by me and considered in my medical decision making (see chart for details).    MDM Rules/Calculators/A&P                      29 yo F with a chief complaints of a headache and a fever.  Has had a headache for about a week and a fever yesterday.  Persisting into today.  Denies any other infectious symptoms other than diarrhea.  She is well-appearing and nontoxic.  Most likely this is viral in nature.  Strongly doubt meningitis.  Will give a headache cocktail here.  Test for the novel coronavirus.  PCP follow-up.  Patient feeling much better on reassessment complete resolution of her headache.  While the patient follow-up with her PCP.  7:21 PM:  I have discussed the diagnosis/risks/treatment options with the patient and believe the pt to be eligible for  discharge home to follow-up with PCP. We also discussed returning to the ED immediately if new or worsening sx occur. We discussed the sx which are most concerning (e.g., sudden worsening pain, fever, inability to tolerate by mouth, neck stiffness) that necessitate immediate return. Medications administered to the patient during their visit and any new prescriptions provided to the patient are listed below.  Medications given during this visit Medications  prochlorperazine (COMPAZINE) injection 10 mg (10 mg Intramuscular Given 12/12/19 1832)  diphenhydrAMINE (BENADRYL) injection 25 mg (25 mg Intramuscular Given 12/12/19 1831)  dexamethasone (DECADRON) tablet 10 mg (10 mg Oral Given 12/12/19 1833)     The patient appears reasonably screen and/or stabilized for discharge and I doubt any other medical condition or other [facility] requiring further screening, evaluation, or treatment in the ED at this time prior to discharge.    Christine Rose was evaluated in Emergency Department on 12/12/2019 for the symptoms described in the history of present illness. He/she was evaluated in the context of the global COVID-19 pandemic, which necessitated consideration that the patient might be at risk for infection with the SARS-CoV-2 virus that causes COVID-19. Institutional protocols and algorithms that pertain to the evaluation of patients at risk for COVID-19 are in a state of rapid change based on information released by regulatory bodies including the CDC and federal and state organizations. These policies and algorithms were followed during the patient's care in the ED.  Final Clinical Impression(s) / ED Diagnoses Final diagnoses:  Left-sided headache  Fever, unspecified fever cause  Diarrhea, unspecified type    Rx / DC Orders ED Discharge Orders    None       Melene Plan, DO 12/12/19 1921

## 2019-12-12 NOTE — Discharge Instructions (Signed)
Take tylenol 2 pills 4 times a day and motrin 4 pills 3 times a day.  Drink plenty of fluids.  Return for worsening shortness of breath, headache, confusion. Follow up with your family doctor.   

## 2019-12-12 NOTE — ED Triage Notes (Signed)
Pt states that Friday started her menstrual cycle. Yesterday started having headache, loose stools, chills. Today having a fever but hasnt taken anything for it.

## 2020-03-13 LAB — OB RESULTS CONSOLE RUBELLA ANTIBODY, IGM: Rubella: IMMUNE

## 2020-03-13 LAB — OB RESULTS CONSOLE HEPATITIS B SURFACE ANTIGEN: Hepatitis B Surface Ag: NEGATIVE

## 2020-03-13 LAB — OB RESULTS CONSOLE HIV ANTIBODY (ROUTINE TESTING): HIV: NONREACTIVE

## 2020-03-13 LAB — OB RESULTS CONSOLE GC/CHLAMYDIA: Chlamydia: POSITIVE

## 2020-04-11 LAB — OB RESULTS CONSOLE GC/CHLAMYDIA: Chlamydia: NEGATIVE

## 2020-07-03 LAB — OB RESULTS CONSOLE RPR: RPR: NONREACTIVE

## 2020-07-24 ENCOUNTER — Other Ambulatory Visit: Payer: Self-pay | Admitting: Obstetrics

## 2020-07-24 DIAGNOSIS — Z363 Encounter for antenatal screening for malformations: Secondary | ICD-10-CM

## 2020-07-24 DIAGNOSIS — O43193 Other malformation of placenta, third trimester: Secondary | ICD-10-CM

## 2020-07-24 DIAGNOSIS — Z3A32 32 weeks gestation of pregnancy: Secondary | ICD-10-CM

## 2020-08-12 NOTE — L&D Delivery Note (Signed)
Delivery Note Patient pushed for 25 minutes.  At 8:16 AM a viable female was delivered via Vaginal, Spontaneous (Presentation: Right Occiput Anterior).  APGAR: 9, 9; weight 3286 gm (7lb 3.9oz) .   Placenta status: Spontaneous, Intact.  Cord: 3 vessels with the following complications: None.  Cord pH: n/a  Anesthesia: Epidural Episiotomy: None Lacerations: None Suture Repair: n/a Est. Blood Loss (mL): 100  Mom to postpartum.  Baby to Couplet care / Skin to Skin.  [facility] GEFFEL Christine Rose 09/21/2020, 8:42 AM

## 2020-08-17 ENCOUNTER — Encounter: Payer: Self-pay | Admitting: *Deleted

## 2020-08-21 ENCOUNTER — Ambulatory Visit: Payer: Medicaid Other | Admitting: *Deleted

## 2020-08-21 ENCOUNTER — Encounter: Payer: Self-pay | Admitting: *Deleted

## 2020-08-21 ENCOUNTER — Other Ambulatory Visit: Payer: Self-pay

## 2020-08-21 ENCOUNTER — Ambulatory Visit: Payer: Medicaid Other | Attending: Obstetrics

## 2020-08-21 VITALS — BP 113/63 | HR 98

## 2020-08-21 DIAGNOSIS — Z3A32 32 weeks gestation of pregnancy: Secondary | ICD-10-CM

## 2020-08-21 DIAGNOSIS — O43193 Other malformation of placenta, third trimester: Secondary | ICD-10-CM | POA: Diagnosis not present

## 2020-08-21 DIAGNOSIS — Z363 Encounter for antenatal screening for malformations: Secondary | ICD-10-CM | POA: Diagnosis not present

## 2020-08-21 DIAGNOSIS — O43199 Other malformation of placenta, unspecified trimester: Secondary | ICD-10-CM

## 2020-09-13 LAB — OB RESULTS CONSOLE GBS: GBS: NEGATIVE

## 2020-09-20 ENCOUNTER — Encounter (HOSPITAL_COMMUNITY): Payer: Self-pay | Admitting: Obstetrics and Gynecology

## 2020-09-20 ENCOUNTER — Inpatient Hospital Stay (HOSPITAL_COMMUNITY)
Admission: AD | Admit: 2020-09-20 | Discharge: 2020-09-22 | DRG: 807 | Disposition: A | Discharge status: Home / Self Care | Payer: Medicaid Other | Attending: Obstetrics | Admitting: Obstetrics

## 2020-09-20 DIAGNOSIS — O43123 Velamentous insertion of umbilical cord, third trimester: Secondary | ICD-10-CM | POA: Diagnosis present

## 2020-09-20 DIAGNOSIS — Z3A37 37 weeks gestation of pregnancy: Secondary | ICD-10-CM | POA: Diagnosis not present

## 2020-09-20 DIAGNOSIS — Z20822 Contact with and (suspected) exposure to covid-19: Secondary | ICD-10-CM | POA: Diagnosis present

## 2020-09-20 DIAGNOSIS — O4292 Full-term premature rupture of membranes, unspecified as to length of time between rupture and onset of labor: Principal | ICD-10-CM | POA: Diagnosis present

## 2020-09-20 LAB — CBC
HCT: 31 % — ABNORMAL LOW (ref 36.0–46.0)
Hemoglobin: 9.9 g/dL — ABNORMAL LOW (ref 12.0–15.0)
MCH: 26.3 pg (ref 26.0–34.0)
MCHC: 31.9 g/dL (ref 30.0–36.0)
MCV: 82.2 fL (ref 80.0–100.0)
Platelets: 260 10*3/uL (ref 150–400)
RBC: 3.77 MIL/uL — ABNORMAL LOW (ref 3.87–5.11)
RDW: 15.4 % (ref 11.5–15.5)
WBC: 7.6 10*3/uL (ref 4.0–10.5)
nRBC: 0 % (ref 0.0–0.2)

## 2020-09-20 LAB — RESP PANEL BY RT-PCR (FLU A&B, COVID) ARPGX2
Influenza A by PCR: NEGATIVE
Influenza B by PCR: NEGATIVE
SARS Coronavirus 2 by RT PCR: NEGATIVE

## 2020-09-20 LAB — TYPE AND SCREEN
ABO/RH(D): A POS
Antibody Screen: NEGATIVE

## 2020-09-20 LAB — AMNISURE RUPTURE OF MEMBRANE (ROM) NOT AT ARMC: Amnisure ROM: POSITIVE

## 2020-09-20 LAB — POCT FERN TEST: POCT Fern Test: NEGATIVE

## 2020-09-20 MED ORDER — FENTANYL CITRATE (PF) 100 MCG/2ML IJ SOLN
50.0000 ug | INTRAMUSCULAR | Status: DC | PRN
  Filled 2020-09-20 (×2): qty 2

## 2020-09-20 MED ORDER — LIDOCAINE HCL (PF) 1 % IJ SOLN: 30.0000 mL | INTRAMUSCULAR | Status: DC | PRN

## 2020-09-20 MED ORDER — OXYCODONE-ACETAMINOPHEN 5-325 MG PO TABS: 2.0000 | ORAL_TABLET | ORAL | Status: DC | PRN

## 2020-09-20 MED ORDER — OXYCODONE-ACETAMINOPHEN 5-325 MG PO TABS: 1.0000 | ORAL_TABLET | ORAL | Status: DC | PRN

## 2020-09-20 MED ORDER — LACTATED RINGERS IV SOLN: 500.0000 mL | INTRAVENOUS | Status: DC | PRN

## 2020-09-20 MED ORDER — SOD CITRATE-CITRIC ACID 500-334 MG/5ML PO SOLN: 30.0000 mL | ORAL | Status: DC | PRN

## 2020-09-20 MED ORDER — LACTATED RINGERS IV SOLN: INTRAVENOUS | Status: DC

## 2020-09-20 MED ORDER — ACETAMINOPHEN 325 MG PO TABS: 650.0000 mg | ORAL_TABLET | ORAL | Status: DC | PRN

## 2020-09-20 MED ORDER — OXYTOCIN BOLUS FROM INFUSION
333.0000 mL | Freq: Once | INTRAVENOUS | Status: AC
  Administered 2020-09-21: 333 mL via INTRAVENOUS

## 2020-09-20 MED ORDER — ONDANSETRON HCL 4 MG/2ML IJ SOLN: 4.0000 mg | Freq: Four times a day (QID) | INTRAMUSCULAR | Status: DC | PRN

## 2020-09-20 MED ORDER — OXYTOCIN-SODIUM CHLORIDE 30-0.9 UT/500ML-% IV SOLN
2.5000 [IU]/h | INTRAVENOUS | Status: DC
  Filled 2020-09-20: qty 500

## 2020-09-20 NOTE — MAU Provider Note (Signed)
S: Ms. Christine Rose is a 30 y.o. G3P1011 at [redacted]w[redacted]d  who presents to MAU today complaining of leaking of fluid since 6pm.  She denies vaginal bleeding. She endorses contractions. She reports normal fetal movement.  She reports that she started having leaking of fluid at 6 pm. It soaked her underwear and also wet the set of the car she was riding in.   O: BP 129/78   Pulse 75   Temp 98.3 F (36.8 C)   Resp 17   Wt 89.4 kg   LMP 01/04/2020   BMI 33.83 kg/m  GENERAL: Well-developed, well-nourished female in no acute distress.  HEAD: Normocephalic, atraumatic.  CHEST: Normal effort of breathing, regular heart rate ABDOMEN: Soft, nontender, gravid PELVIC: Normal external female genitalia. Vagina is pink and rugated. Cervix with normal contour, no lesions. Thick clumpy white discharge in the vagina, difficult to tell pooling.   Cervical exam:  Exam by:: Kooistra,CNM   Fetal Monitoring: Baseline: 145 Variability: mod Accelerations: present 15 x 15 Decelerations: none Contractions: none  Results for orders placed or performed during the hospital encounter of 09/20/20 (from the past 24 hour(s))  POCT fern test     Status: Normal   Collection Time: 09/20/20  7:32 PM  Result Value Ref Range   POCT Fern Test Negative = intact amniotic membranes   Amnisure rupture of membrane (rom)not at [facility]     Status: None   Collection Time: 09/20/20  7:34 PM  Result Value Ref Range   Amnisure ROM POSITIVE    Sterile fern negative, will send Amnisure  A: [facility] at [redacted]w[redacted]d  SROM  P: Report given to RN to contact MD on call for further instructions  Marylene Land, CNM 09/20/2020 8:31 PM

## 2020-09-20 NOTE — MAU Note (Signed)
Patient reports SROM today around 1800 after her appointment. 2 cm in the office today.  Patient reports some irregular ctx now.  Endorses + FM.  Denies VB.  Denies complications w/ her pregnancy.

## 2020-09-20 NOTE — H&P (Signed)
Christine Rose is a 30 y.o. female G28P1011 [redacted]w[redacted]d presenting for LOF at 1800. Denies VB, Contractions. Normal FM.   ROM confirmed by amnisure in MAU  Pregnancy c/b: 1. Marginal cord insertion: 32 week growth [place] on 1/10 showed EFW 40% (4lb9oz) 2. Chlamydia infection first trimester: treated, test of cure negative 3. History of shoulder dystocia: previous delivery, baby weighed 6lb9oz  OB History    Gravida  3   Para  1   Term  1   Preterm  0   AB  1   Living  1     SAB  1   IAB  0   Ectopic  0   Multiple  0   Live Births  1          Past Medical History:  Diagnosis Date  . Allergic rhinitis   . ASCUS (atypical squamous cells of undetermined significance) on Pap smear 01/08/2012   hpv pos,follow up pap 09-21-12 Ascus, pap 05-20-14 Neg  . Chlamydia 2010 and 10/06/15  . Gonorrhea 12/05/2017  . History of chicken pox   . Osgood-Schlatter's disease    hx  . Vaginal Pap smear, abnormal    Past Surgical History:  Procedure Laterality Date  . NO PAST SURGERIES     Family History: family history includes Breast cancer (age of onset: 40) in her maternal grandmother; Diabetes in her maternal grandfather, maternal grandmother, paternal grandfather, and paternal grandmother; Hypertension in her maternal grandfather, maternal grandmother, paternal grandfather, and paternal grandmother; Other in her mother; Stroke (age of onset: 34) in her mother. Social History:  reports that she has never smoked. She has never used smokeless tobacco. She reports that she does not drink alcohol and does not use drugs.     Maternal Diabetes: No Genetic Screening: Declined Maternal Ultrasounds/Referrals: Normal, normal growth [place] at 32 weeks as above Fetal Ultrasounds or other Referrals:  None Maternal Substance Abuse:  No Significant Maternal Medications:  None Significant Maternal Lab Results:  Group B Strep negative Other Comments:  None  Review of Systems Per HPI Exam Physical  Exam  Dilation: 2 Effacement (%): Thick Exam by:: Kooistra,CNM Blood pressure 129/78, pulse 75, temperature 98.3 F (36.8 C), resp. rate 17, weight 89.4 kg, last menstrual period 01/04/2020, unknown if currently breastfeeding. NAD, resting comfortably, talking and laughing through contractions Gravid abdomen, EFW 7lbs by [employer]tex per MAU provider Fetal testing: FHR 135bpm, moderate variability, + accels, no decels Toco: ctx q 5-8 mins Prenatal labs: ABORH: A pos Rubella: Immune (08/02 0000) RPR: Nonreactive (11/22 0000)  HBsAg: Negative (08/02 0000)  HIV: Non-reactive (08/02 0000)  GBS: Negative/-- (02/02 0000)   Assessment/Plan: 29Y G3P1011 @ [redacted]w[redacted]d, admit for PROM 1. PROM: discussed recommendation for pitocin at this point for labor augmentation, patient agrees with plan. Start pitocin at 19mu/min and titrate per protocol. Epidural upon patient request.  2. History of shoulder dystocia: shoulder precautions 3. Marginal cord insertion: normal fetal growth on 32 week [place] 4. Declined Tdap, flu, and covid vaccines in pregnancy  Charlett Nose 09/20/2020, 9:18 PM

## 2020-09-21 ENCOUNTER — Encounter (HOSPITAL_COMMUNITY): Payer: Self-pay | Admitting: Obstetrics and Gynecology

## 2020-09-21 ENCOUNTER — Other Ambulatory Visit: Payer: Self-pay

## 2020-09-21 ENCOUNTER — Inpatient Hospital Stay (HOSPITAL_COMMUNITY): Payer: Medicaid Other | Admitting: Anesthesiology

## 2020-09-21 LAB — RPR: RPR Ser Ql: NONREACTIVE

## 2020-09-21 MED ORDER — LACTATED RINGERS IV SOLN: 500.0000 mL | Freq: Once | INTRAVENOUS | Status: DC

## 2020-09-21 MED ORDER — WITCH HAZEL-GLYCERIN EX PADS: 1.0000 "application " | MEDICATED_PAD | CUTANEOUS | Status: DC | PRN

## 2020-09-21 MED ORDER — FENTANYL-BUPIVACAINE-NACL 0.5-0.125-0.9 MG/250ML-% EP SOLN
12.0000 mL/h | EPIDURAL | Status: DC | PRN
Start: 2020-09-21 — End: 2020-09-21
  Filled 2020-09-21: qty 250

## 2020-09-21 MED ORDER — DIBUCAINE (PERIANAL) 1 % EX OINT: 1.0000 "application " | TOPICAL_OINTMENT | CUTANEOUS | Status: DC | PRN

## 2020-09-21 MED ORDER — EPHEDRINE 5 MG/ML INJ: 10.0000 mg | INTRAVENOUS | Status: DC | PRN

## 2020-09-21 MED ORDER — PHENYLEPHRINE 40 MCG/ML (10ML) SYRINGE FOR IV PUSH (FOR BLOOD PRESSURE SUPPORT): 80.0000 ug | PREFILLED_SYRINGE | INTRAVENOUS | Status: DC | PRN

## 2020-09-21 MED ORDER — TETANUS-DIPHTH-ACELL PERTUSSIS 5-2.5-18.5 LF-MCG/0.5 IM SUSY: 0.5000 mL | PREFILLED_SYRINGE | Freq: Once | INTRAMUSCULAR | Status: DC

## 2020-09-21 MED ORDER — OXYCODONE HCL 5 MG PO TABS
5.0000 mg | ORAL_TABLET | ORAL | Status: DC | PRN
Start: 2020-09-21 — End: 2020-09-22

## 2020-09-21 MED ORDER — ACETAMINOPHEN 325 MG PO TABS
650.0000 mg | ORAL_TABLET | ORAL | Status: DC | PRN
  Administered 2020-09-21: 650 mg via ORAL
  Filled 2020-09-21: qty 2

## 2020-09-21 MED ORDER — SIMETHICONE 80 MG PO CHEW: 80.0000 mg | CHEWABLE_TABLET | ORAL | Status: DC | PRN

## 2020-09-21 MED ORDER — SENNOSIDES-DOCUSATE SODIUM 8.6-50 MG PO TABS
2.0000 | ORAL_TABLET | ORAL | Status: DC
  Administered 2020-09-21: 2 via ORAL
  Filled 2020-09-21: qty 2

## 2020-09-21 MED ORDER — FENTANYL CITRATE (PF) 100 MCG/2ML IJ SOLN
INTRAMUSCULAR | Status: DC | PRN
  Administered 2020-09-21 (×2): 100 ug via EPIDURAL

## 2020-09-21 MED ORDER — ONDANSETRON HCL 4 MG/2ML IJ SOLN: 4.0000 mg | INTRAMUSCULAR | Status: DC | PRN

## 2020-09-21 MED ORDER — DIPHENHYDRAMINE HCL 50 MG/ML IJ SOLN: 12.5000 mg | INTRAMUSCULAR | Status: DC | PRN

## 2020-09-21 MED ORDER — COCONUT OIL OIL: 1.0000 "application " | TOPICAL_OIL | Status: DC | PRN

## 2020-09-21 MED ORDER — OXYCODONE HCL 5 MG PO TABS: 10.0000 mg | ORAL_TABLET | ORAL | Status: DC | PRN

## 2020-09-21 MED ORDER — PRENATAL MULTIVITAMIN CH
1.0000 | ORAL_TABLET | Freq: Every day | ORAL | Status: DC
  Administered 2020-09-21: 1 via ORAL
  Filled 2020-09-21: qty 1

## 2020-09-21 MED ORDER — FENTANYL-BUPIVACAINE-NACL 0.5-0.125-0.9 MG/250ML-% EP SOLN
EPIDURAL | Status: DC | PRN
  Administered 2020-09-21: 12 mL/h via EPIDURAL

## 2020-09-21 MED ORDER — BENZOCAINE-MENTHOL 20-0.5 % EX AERO
1.0000 "application " | INHALATION_SPRAY | CUTANEOUS | Status: DC | PRN
  Administered 2020-09-21: 1 via TOPICAL
  Filled 2020-09-21: qty 56

## 2020-09-21 MED ORDER — OXYTOCIN-SODIUM CHLORIDE 30-0.9 UT/500ML-% IV SOLN
1.0000 m[IU]/min | INTRAVENOUS | Status: DC
  Administered 2020-09-21: 2 m[IU]/min via INTRAVENOUS

## 2020-09-21 MED ORDER — ONDANSETRON HCL 4 MG PO TABS: 4.0000 mg | ORAL_TABLET | ORAL | Status: DC | PRN

## 2020-09-21 MED ORDER — DIPHENHYDRAMINE HCL 25 MG PO CAPS: 25.0000 mg | ORAL_CAPSULE | Freq: Four times a day (QID) | ORAL | Status: DC | PRN

## 2020-09-21 MED ORDER — LIDOCAINE HCL (PF) 1 % IJ SOLN
INTRAMUSCULAR | Status: DC | PRN
  Administered 2020-09-21 (×2): 4 mL via EPIDURAL
  Administered 2020-09-21: 5 mL via EPIDURAL

## 2020-09-21 MED ORDER — TERBUTALINE SULFATE 1 MG/ML IJ SOLN: 0.2500 mg | Freq: Once | INTRAMUSCULAR | Status: DC | PRN

## 2020-09-21 MED ORDER — BUPIVACAINE HCL (PF) 0.25 % IJ SOLN
INTRAMUSCULAR | Status: DC | PRN
  Administered 2020-09-21: 8 mL via EPIDURAL
  Administered 2020-09-21: 2 mg via EPIDURAL

## 2020-09-21 MED ORDER — IBUPROFEN 600 MG PO TABS
600.0000 mg | ORAL_TABLET | Freq: Four times a day (QID) | ORAL | Status: DC
  Administered 2020-09-21 – 2020-09-22 (×4): 600 mg via ORAL
  Filled 2020-09-21 (×5): qty 1

## 2020-09-21 NOTE — Progress Notes (Addendum)
I was made aware by RN that patient received epidural around 2:15am, initially had relief and then became uncomfortable again. Epidural was replaced and again patient initially had relief and now is uncomfortable with contractions.   S: patient rates lower abdominal and lower back contraction pain as 9/10  Gen: patient moaning with contractions SVE by me: 7/90/-1 FHR 130bpm, moderate variability, absent accels, early decels Toco: contractions q 2-5 mins  A/P 29Y G3P1011 @ [redacted]w[redacted]d, admitted for PROM 1. Labor: progressing well with pitocin at 39mu/min 2. Pain control: difficulty with epidural pain control, which initially provides relief and then pain returns. Patient is not coping well. Provided emotional support. OB Anesthesiologist called back by RN, he is currently providing care to another patient, so we will turn off pitocin momentarily until he is able to reassess her epidural. 3. Fetal wellbeing: cat I with early decels and moderate variability  M. Timothy Lasso, MD 09/21/20 5:49 AM   ADDENDUM:  Epidural replaced and pump adjusted by [facility] anesthesiologist, patient now resting comfortably.  FHR 120bpm, mod variability, + accels, no decels Toco: ctx q 2-3 mins  Plan to restart pitocin at 49mu/min when/if contractions space.   Alinda Deem, MD 09/21/20 6:21 AM

## 2020-09-21 NOTE — Anesthesia Postprocedure Evaluation (Signed)
Anesthesia Post Note  Patient: Christine Rose  Procedure(s) Performed: AN AD HOC LABOR EPIDURAL     Patient location during evaluation: Mother Baby Anesthesia Type: Epidural Level of consciousness: awake and alert and oriented Pain management: satisfactory to patient Vital Signs Assessment: post-procedure vital signs reviewed and stable Respiratory status: respiratory function stable Cardiovascular status: stable Postop Assessment: no headache, no backache, epidural receding, patient able to bend at knees, no signs of nausea or vomiting and adequate PO intake Anesthetic complications: no Comments: The patient and her significant other requested to talk with the physician anesthesia team that provided her care.   No complications documented.  Last Vitals:  Vitals:   09/21/20 1025 09/21/20 1125  BP: 127/86 112/68  Pulse: 65 71  Resp: 17 18  Temp: 36.9 C 36.6 C  SpO2: 100%     Last Pain:  Vitals:   09/21/20 1150  TempSrc:   PainSc: 0-No pain   Pain Goal: Patients Stated Pain Goal: 5 (09/21/20 0105)                 Karleen Dolphin

## 2020-09-21 NOTE — Anesthesia Preprocedure Evaluation (Addendum)
Anesthesia Evaluation  Patient identified by MRN, date of birth, ID band Patient awake    Reviewed: Allergy & Precautions, NPO status , Patient's Chart, lab work & pertinent test results  Airway Mallampati: II  TM Distance: >3 FB Neck ROM: Full    Dental no notable dental hx. (+) Teeth Intact, Dental Advisory Given   Pulmonary neg pulmonary ROS,    Pulmonary exam normal breath sounds clear to auscultation       Cardiovascular Exercise Tolerance: Good negative cardio ROS Normal cardiovascular exam Rhythm:Regular Rate:Normal     Neuro/Psych negative neurological ROS     GI/Hepatic GERD  ,  Endo/Other  negative endocrine ROS  Renal/GU negative Renal ROS     Musculoskeletal   Abdominal   Peds  Hematology Lab Results      Component                Value               Date                      WBC                      7.6                 09/20/2020                HGB                      9.9 (L)             09/20/2020                HCT                      31.0 (L)            09/20/2020                MCV                      82.2                09/20/2020                PLT                      260                 09/20/2020              Anesthesia Other Findings   Reproductive/Obstetrics (+) Pregnancy                            Anesthesia Physical Anesthesia Plan  ASA: II  Anesthesia Plan: Epidural   Post-op Pain Management:    Induction:   PONV Risk Score and Plan:   Airway Management Planned:   Additional Equipment:   Intra-op Plan:   Post-operative Plan:   Informed Consent: I have reviewed the patients History and Physical, chart, labs and discussed the procedure including the risks, benefits and alternatives for the proposed anesthesia with the patient or authorized representative who has indicated his/her understanding and acceptance.     Dental advisory given  Plan  Discussed with: CRNA  Anesthesia Plan Comments: (37.2 wk G3P1 w IUGR  for LEA)       Anesthesia Quick Evaluation

## 2020-09-21 NOTE — Lactation Note (Signed)
This note was copied from a baby's chart. Lactation Consultation Note  Patient Name: Boy Jasneet Schobert BWLSL'H Date: 09/21/2020 Reason for consult: L&D Initial assessment Age:31 hours  LC to L&D for initial visit. Per mom and RN, baby bf well on both sides p delivery. When LC arrived, RN was weighing baby Civil engineer, contracting). Mom reports that she bf her last baby without difficulty and was pleased that this baby bf p delivery. We briefly reviewed feeding with cues and delay of bottle/pacifier. Mom is aware that [facility] services are available on [facility].   Maternal Data Does the patient have breastfeeding experience prior to this delivery?: Yes How long did the patient breastfeed?: 8 mo  Feeding Mother's Current Feeding Choice: Breast Milk   Consult Status Consult Status: Follow-up Follow-up type: In-patient   Elder Negus, MA IBCLC 09/21/2020, 9:36 AM

## 2020-09-21 NOTE — Anesthesia Procedure Notes (Signed)
Epidural Patient location during procedure: OB Start time: 09/21/2020 2:17 AM End time: 09/21/2020 2:33 AM  Staffing Anesthesiologist: Trevor Iha, MD Performed: anesthesiologist   Preanesthetic Checklist Completed: patient identified, IV checked, site marked, risks and benefits discussed, surgical consent, monitors and equipment checked, pre-op evaluation and timeout performed  Epidural Patient position: sitting Prep: DuraPrep and site prepped and draped Patient monitoring: continuous pulse ox and blood pressure Approach: midline Location: L3-L4 Injection technique: LOR air  Needle:  Needle type: Tuohy  Needle gauge: 17 G Needle length: 9 cm and 9 Needle insertion depth: 7 cm Catheter type: closed end flexible Catheter size: 19 Gauge Catheter at skin depth: 12 cm Test dose: negative  Assessment Events: blood not aspirated, injection not painful, no injection resistance, no paresthesia and negative IV test  Additional Notes Patient identified. Risks/Benefits/Options discussed with patient including but not limited to bleeding, infection, nerve damage, paralysis, failed block, incomplete pain control, headache, blood pressure changes, nausea, vomiting, reactions to medication both or allergic, itching and postpartum back pain. Confirmed with bedside nurse the patient's most recent platelet count. Confirmed with patient that they are not currently taking any anticoagulation, have any bleeding history or any family history of bleeding disorders. Patient expressed understanding and wished to proceed. All questions were answered. Sterile technique was used throughout the entire procedure. Please see nursing notes for vital signs. Test dose was given through epidural needle and negative prior to continuing to dose epidural or start infusion. Warning signs of high block given to the patient including shortness of breath, tingling/numbness in hands, complete motor block, or any  concerning symptoms with instructions to call for help. Patient was given instructions on fall risk and not to get out of bed. All questions and concerns addressed with instructions to call with any issues. 2 Attempt (S) . Patient tolerated procedure well.

## 2020-09-22 LAB — CBC
HCT: 26.5 % — ABNORMAL LOW (ref 36.0–46.0)
Hemoglobin: 8.1 g/dL — ABNORMAL LOW (ref 12.0–15.0)
MCH: 25.4 pg — ABNORMAL LOW (ref 26.0–34.0)
MCHC: 30.6 g/dL (ref 30.0–36.0)
MCV: 83.1 fL (ref 80.0–100.0)
Platelets: 232 10*3/uL (ref 150–400)
RBC: 3.19 MIL/uL — ABNORMAL LOW (ref 3.87–5.11)
RDW: 15.5 % (ref 11.5–15.5)
WBC: 9.8 10*3/uL (ref 4.0–10.5)
nRBC: 0 % (ref 0.0–0.2)

## 2020-09-22 NOTE — Discharge Summary (Signed)
Postpartum Discharge Summary  Date of Service updated      Patient Name: Christine Rose DOB: 1990-11-14 MRN: [id number]  Date of admission: 09/20/2020 Delivery date:09/21/2020  Delivering provider: Jerelyn Charles  Date of discharge: 09/22/2020  Admitting diagnosis: Full-term premature rupture of membranes [O42.92] Intrauterine pregnancy: [redacted]w[redacted]d    Secondary diagnosis:  Active Problems:   Full-term premature rupture of membranes  Additional problems: none    Discharge diagnosis: Term Pregnancy Delivered                                              Post partum procedures:none Augmentation: N/A Complications: None  Hospital course: Onset of Labor With Vaginal Delivery      30y.o. yo [id number] 357w2das admitted in Latent Labor on 09/20/2020. Patient had an uncomplicated labor course as follows:  Membrane Rupture Time/Date: 6:00 PM ,09/20/2020   Delivery Method:Vaginal, Spontaneous  Episiotomy: None  Lacerations:  None  Patient had an uncomplicated postpartum course.  She is ambulating, tolerating a regular diet, passing flatus, and urinating well. Patient is discharged home in stable condition on 09/22/20.  Newborn Data: Birth date:09/21/2020  Birth time:8:16 AM  Gender:Female  Living status:Living  Apgars:9 ,9  Weight:3286 g   Magnesium Sulfate received: No BMZ received: No Rhophylac:No MMR:No T-DaP:declined Flu: N/A Transfusion:No  Physical exam  Vitals:   09/21/20 1125 09/21/20 1520 09/21/20 2030 09/21/20 2345  BP: 112/68 107/64 102/67 102/64  Pulse: 71 73 91 83  Resp: 18 16 20 18   Temp: 97.9 F (36.6 C) 98.7 F (37.1 C) 98.2 F (36.8 C) 97.7 F (36.5 C)  TempSrc: Oral Oral Oral   SpO2:  99% 99%   Weight:      Height:        Labs: Lab Results  Component Value Date   WBC 9.8 09/22/2020   HGB 8.1 (L) 09/22/2020   HCT 26.5 (L) 09/22/2020   MCV 83.1 09/22/2020   PLT 232 09/22/2020   CMP Latest Ref Rng & Units 03/30/2018  Glucose 70 - 99 mg/dL 56(L)   BUN 6 - 20 mg/dL 6  Creatinine 0.44 - 1.00 mg/dL 0.52  Sodium 135 - 145 mmol/L 138  Potassium 3.5 - 5.1 mmol/L 3.6  Chloride 98 - 111 mmol/L 107  CO2 22 - 32 mmol/L 24  Calcium 8.9 - 10.3 mg/dL 9.0  Total Protein 6.5 - 8.1 g/dL -  Total Bilirubin 0.3 - 1.2 mg/dL -  Alkaline Phos 38 - 126 U/L -  AST 15 - 41 U/L -  ALT 14 - 54 U/L -   Edinburgh Score: Edinburgh Postnatal Depression Scale Screening Tool 09/21/2020  I have been able to laugh and see the funny side of things. 0  I have looked forward with enjoyment to things. 0  I have blamed myself unnecessarily when things went wrong. 0  I have been anxious or worried for no good reason. 0  I have felt scared or panicky for no good reason. 0  Things have been getting on top of me. 0  I have been so unhappy that I have had difficulty sleeping. 0  I have felt sad or miserable. 0  I have been so unhappy that I have been crying. 0  The thought of harming myself has occurred to me. 0  Edinburgh Postnatal Depression Scale Total 0  After visit meds:  Allergies as of 09/22/2020   No Known Allergies     Medication List    You have not been prescribed any medications.          Discharge Care Instructions  (From admission, onward)         Start     Ordered   09/22/20 0000  Discharge wound care:       Comments: Sitz baths and icepacks to perineum.  If stitches, they will dissolve.   09/22/20 0848           Discharge home in stable condition Infant Feeding: ? Infant Disposition:home with mother Discharge instruction: per After Visit Summary and Postpartum booklet. Activity: Advance as tolerated. Pelvic rest for 6 weeks.  Diet: routine diet Anticipated Birth Control: Unsure Postpartum Appointment:4 weeks Additional Postpartum F/U: none Future Appointments:No future appointments. Follow up Visit:  Follow-up Information    Jerelyn Charles, MD Follow up in 4 week(s).   Specialty: Obstetrics Contact  information: [place] [address] [phone]             Circ cancelled because of too small area between tip and scrotum.  Advised to check with peds and possibly get consult when appropriate for Uro to do circ.      09/22/2020 Daria Pastures, MD

## 2020-09-22 NOTE — Progress Notes (Addendum)
Patient is eating, ambulating, voiding.  Pain control is good.  Vitals:   09/21/20 1125 09/21/20 1520 09/21/20 2030 09/21/20 2345  BP: 112/68 107/64 102/67 102/64  Pulse: 71 73 91 83  Resp: 18 16 20 18   Temp: 97.9 F (36.6 C) 98.7 F (37.1 C) 98.2 F (36.8 C) 97.7 F (36.5 C)  TempSrc: Oral Oral Oral   SpO2:  99% 99%   Weight:      Height:        Fundus firm Perineum without swelling.  Lab Results  Component Value Date   WBC 9.8 09/22/2020   HGB 8.1 (L) 09/22/2020   HCT 26.5 (L) 09/22/2020   MCV 83.1 09/22/2020   PLT 232 09/22/2020    --/--/A POS (02/09 2055)/RI  A/P Post partum day 1.  Routine care.  Expect d/c routine.  Iron.   Parents desires circumsision but penis is small and there is not a lot of room between the tip and the scrotum.  I d/w parents that this is a cosmetic procedure and that if there is any concern for scar tissue or suboptimal result, we should not do it.  Circ is cancelled.  Pt desires to go home today.    nMichelle A 10-24-1978

## 2021-02-18 IMAGING — US US MFM OB DETAIL+14 WK
1 series · 13 of 28 positions shown · non-contrast
Comparison: none

[Series 1: us mfm ob detail+14 wk · 89 acquisitions, 13 frames shown]
[im 4/89]
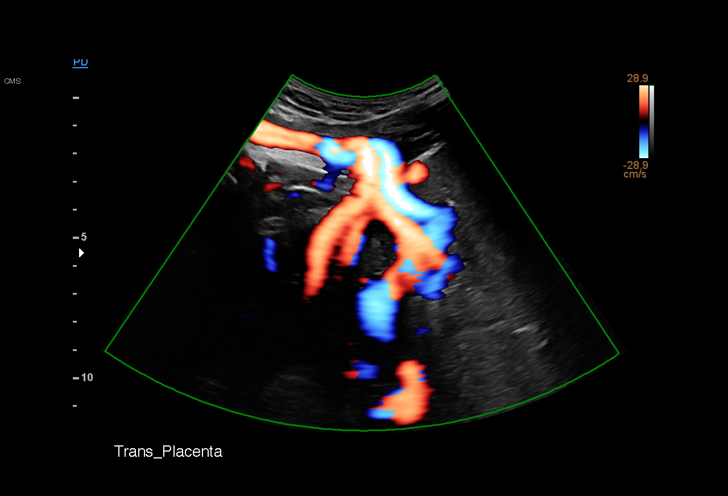
[im 10/89]
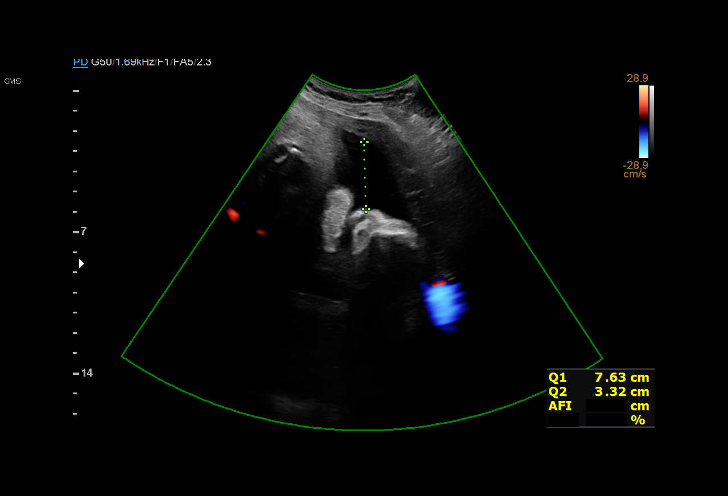
[im 17/89]
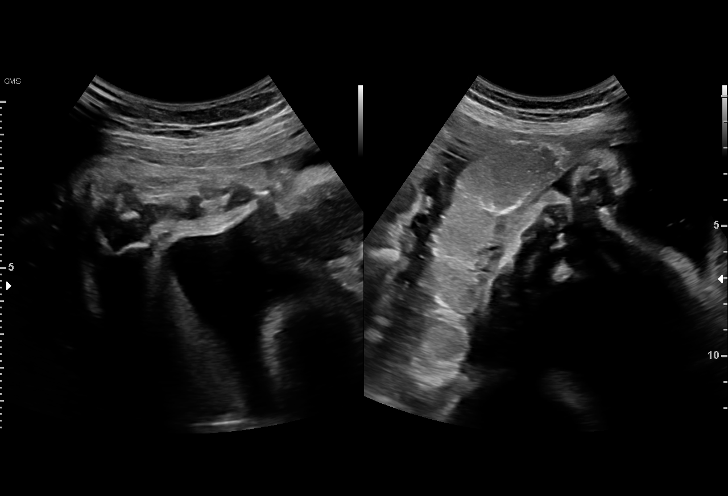
[im 23/89]
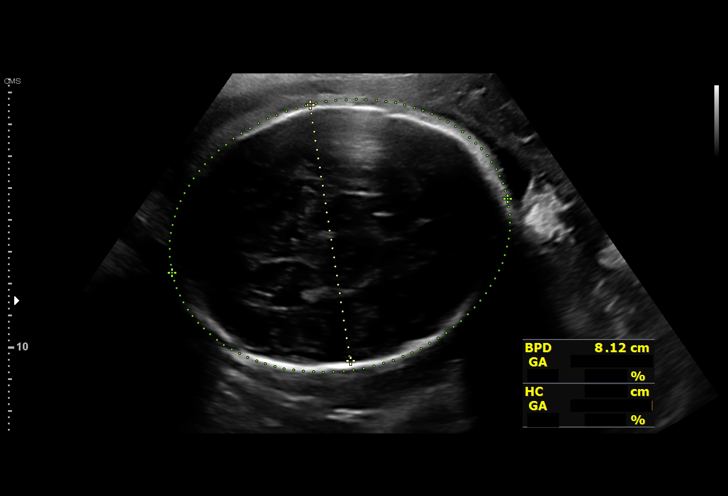
[im 30/89]
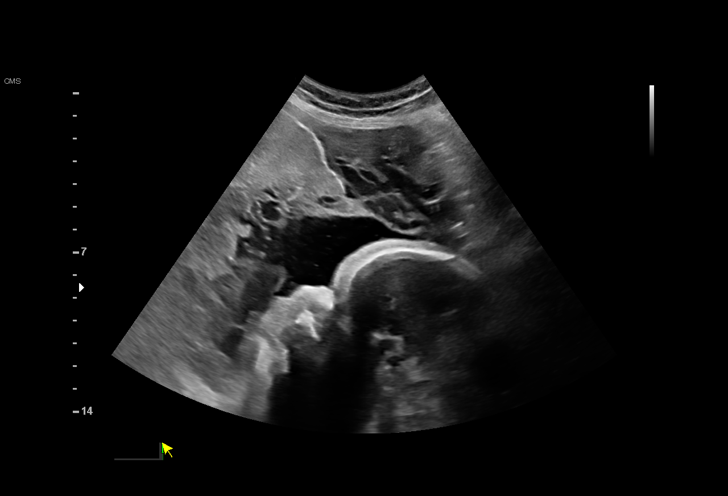
[im 36/89]
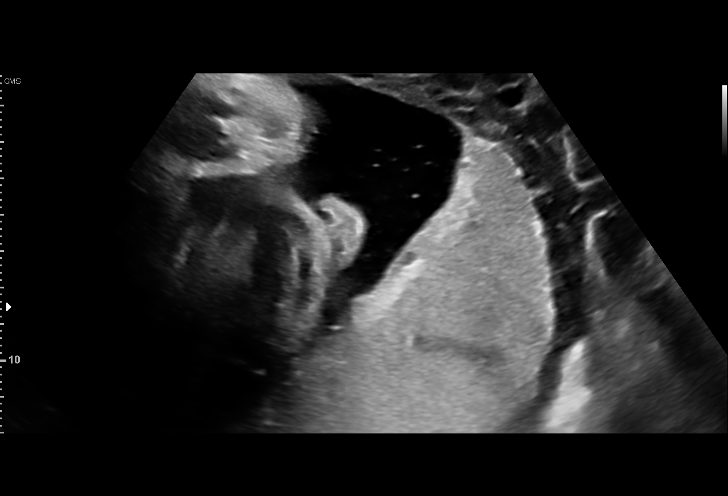
[im 46/89]
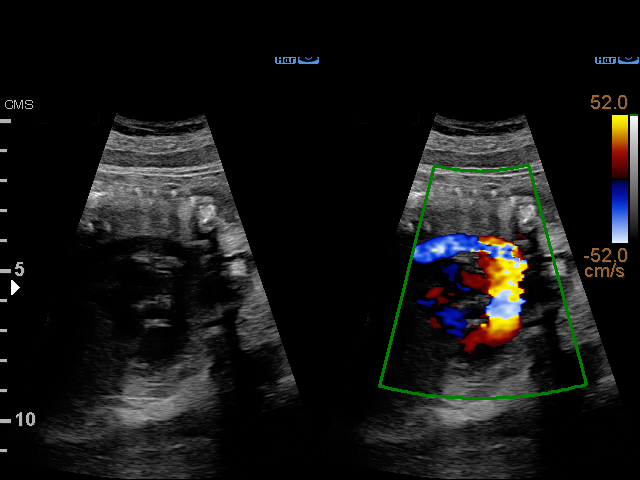
[im 53/89]
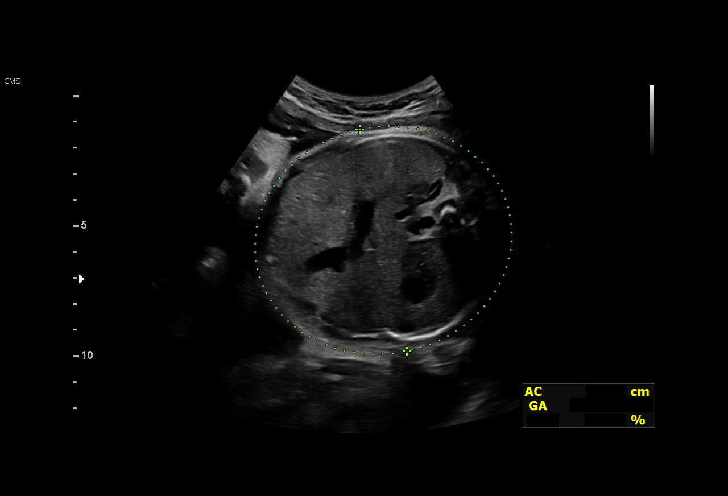
[im 59/89]
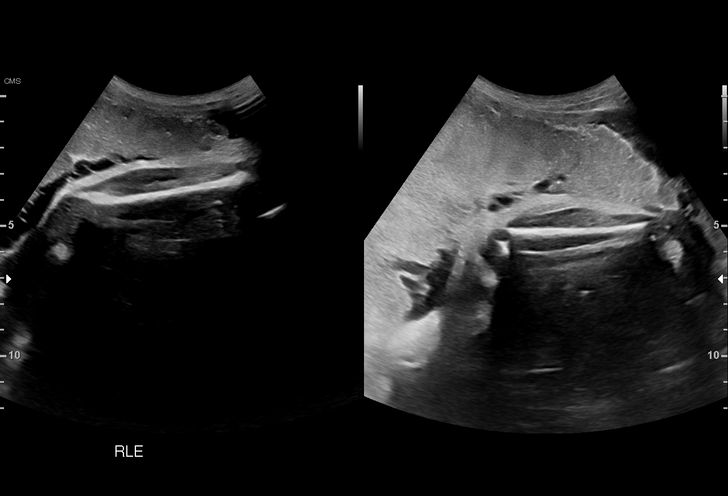
[im 66/89]
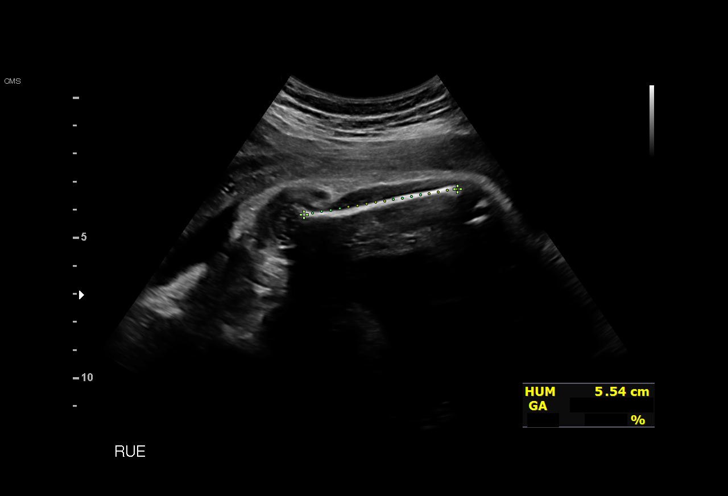
[im 72/89]
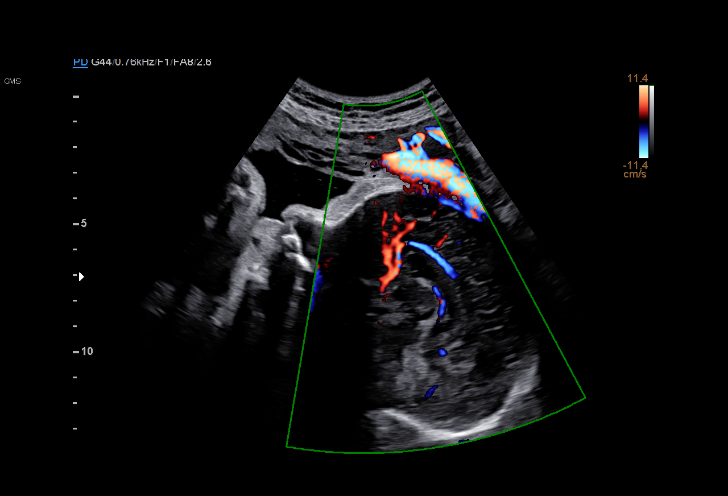
[im 79/89]
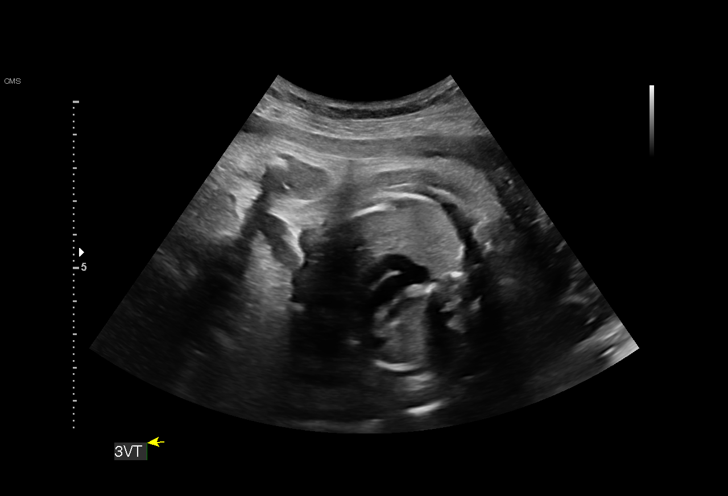
[im 85/89]
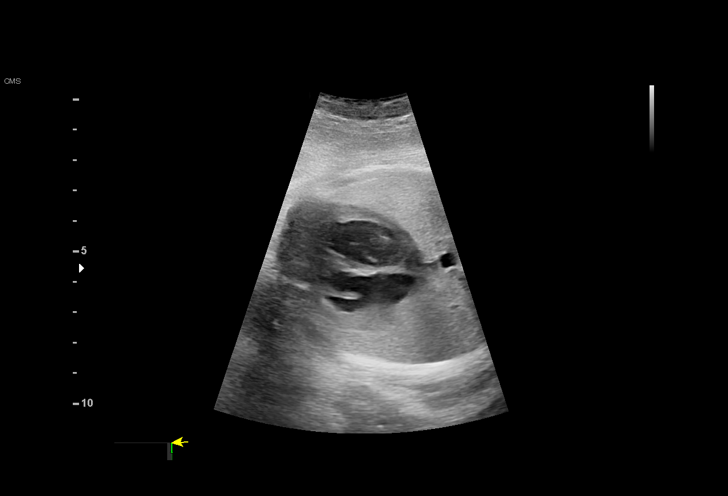

[13 of 28 positions shown; findings below may reference images not displayed]

JAEOH DO

Indications

 Marginal insertion of umbilical cord affecting
 management of mother in third trimester
 32 weeks gestation of pregnancy
 Antenatal screening for malformations
 Declined genetic screening
Fetal Evaluation

 Num Of Fetuses:         1
 Fetal Heart Rate(bpm):  148
 Cardiac Activity:       Observed
 Presentation:           Cephalic
 Placenta:               Posterior
 P. Cord Insertion:      Marginal insertion vs velament

 Amniotic Fluid
 AFI FV:      Within normal limits

 AFI Sum(cm)     %Tile       Largest Pocket(cm)
 18.7            69

 RUQ(cm)       RLQ(cm)       LUQ(cm)        LLQ(cm)

Biometry

 BPD:      81.1  mm     G. Age:  32w 4d         35  %    CI:        73.82   %    70 - 86
                                                         FL/HC:      20.7   %    19.9 -
 HC:      299.8  mm     G. Age:  33w 2d         22  %    HC/AC:      1.03        0.96 -
 AC:      292.1  mm     G. Age:  33w 1d         61  %    FL/BPD:     76.6   %    71 - 87
 FL:       62.1  mm     G. Age:  32w 1d         21  %    FL/AC:      21.3   %    20 - 24
 HUM:        56  mm     G. Age:  32w 4d         52  %
 CER:        46  mm     G. Age:  35w 2d         88  %

 LV:        4.6  mm

 Est. FW:    8057  gm      4 lb 9 oz     40  %
OB History

 Gravidity:    3         Term:   1        Prem:   0        SAB:   1
 TOP:          0       Ectopic:  0        Living: 1
Gestational Age

 LMP:           32w 6d        Date:  01/04/20                 EDD:   10/10/20
 U/S Today:     32w 6d                                        EDD:   10/10/20
 Best:          32w 6d     Det. By:  LMP  (01/04/20)          EDD:   10/10/20
Anatomy

 Cranium:               Appears normal         LVOT:                   Appears normal
 Cavum:                 Appears normal         Aortic Arch:            Appears normal
 Ventricles:            Appears normal         Ductal Arch:            Appears normal
 Choroid Plexus:        Appears normal         Diaphragm:              Not well visualized
 Cerebellum:            Appears normal         Stomach:                Appears normal, left
                                                                       sided
 Posterior Fossa:       Not well visualized    Abdomen:                Appears normal
 Nuchal Fold:           Not applicable (>20    Abdominal Wall:         Appears nml (cord
                        wks GA)                                        insert, abd wall)
 Face:                  Appears normal         Cord Vessels:           Appears normal (3
                        (orbits and profile)                           vessel cord)
 Lips:                  Appears normal         Kidneys:                Appear normal
 Palate:                Not well visualized    Bladder:                Appears normal
 Thoracic:              Appears normal         Spine:                  Limited views
                                                                       appear normal
 Heart:                 Appears normal         Upper Extremities:      Visualized
                        (4CH, axis, and
                        situs)
 RVOT:                  Not well visualized    Lower Extremities:      Appears normal

 Other:  Fetus appears to be a male. Feet visualized. Nasal bone visualized.
         Lenses visualized. Technically difficult due to advanced GA and fetal
         position.
Cervix Uterus Adnexa

 Cervix
 Not visualized (advanced GA >34wks)

 Uterus
 No abnormality visualized.

 Right Ovary
 Within normal limits.

 Left Ovary
 Within normal limits.
 Cul De Sac
 No free fluid seen.

 Adnexa
 No abnormality visualized.
Comments

 This patient was seen for a follow up growth scan due to a
 marginal placental cord insertion that was noted earlier in her
 pregnancy.  She denies any problems since her last exam.
 She was informed that the fetal growth and amniotic fluid
 level appears appropriate for her gestational age.
 The views of the fetal anatomy were limited today due to her
 advanced gestational age.
 Due to the marginal placental cord insertion, she should
 continue to be followed with growth ultrasounds.
 A follow-up exam was not scheduled in our office today.
 Please schedule another growth ultrasound for her in 4 to 5
 weeks in our office if necessary.

## 2022-05-20 ENCOUNTER — Emergency Department (HOSPITAL_COMMUNITY)
Admission: EM | Admit: 2022-05-20 | Discharge: 2022-05-20 | Disposition: A | Discharge status: Home / Self Care | Payer: Medicaid Other | Attending: Emergency Medicine | Admitting: Emergency Medicine

## 2022-05-20 ENCOUNTER — Encounter (HOSPITAL_COMMUNITY): Payer: Self-pay

## 2022-05-20 ENCOUNTER — Other Ambulatory Visit: Payer: Self-pay

## 2022-05-20 DIAGNOSIS — M79605 Pain in left leg: Secondary | ICD-10-CM | POA: Insufficient documentation

## 2022-05-20 DIAGNOSIS — R531 Weakness: Secondary | ICD-10-CM | POA: Insufficient documentation

## 2022-05-20 LAB — BASIC METABOLIC PANEL
Anion gap: 5 (ref 5–15)
BUN: 8 mg/dL (ref 6–20)
CO2: 27 mmol/L (ref 22–32)
Calcium: 8.6 mg/dL — ABNORMAL LOW (ref 8.9–10.3)
Chloride: 106 mmol/L (ref 98–111)
Creatinine, Ser: 0.71 mg/dL (ref 0.44–1.00)
GFR, Estimated: >60 mL/min (ref >60)
Glucose, Bld: 87 mg/dL (ref 70–99)
Potassium: 3.9 mmol/L (ref 3.5–5.1)
Sodium: 138 mmol/L (ref 135–145)

## 2022-05-20 LAB — CBC WITH DIFFERENTIAL/PLATELET
Abs Immature Granulocytes: 0.01 10*3/uL (ref 0.00–0.07)
Basophils Absolute: 0 10*3/uL (ref 0.0–0.1)
Basophils Relative: 1 %
Eosinophils Absolute: 0.1 10*3/uL (ref 0.0–0.5)
Eosinophils Relative: 1 %
HCT: 37.8 % (ref 36.0–46.0)
Hemoglobin: 11.8 g/dL — ABNORMAL LOW (ref 12.0–15.0)
Immature Granulocytes: 0 %
Lymphocytes Relative: 26 %
Lymphs Abs: 1.7 10*3/uL (ref 0.7–4.0)
MCH: 26.4 pg (ref 26.0–34.0)
MCHC: 31.2 g/dL (ref 30.0–36.0)
MCV: 84.6 fL (ref 80.0–100.0)
Monocytes Absolute: 0.5 10*3/uL (ref 0.1–1.0)
Monocytes Relative: 8 %
Neutro Abs: 4 10*3/uL (ref 1.7–7.7)
Neutrophils Relative %: 64 %
Platelets: 282 10*3/uL (ref 150–400)
RBC: 4.47 MIL/uL (ref 3.87–5.11)
RDW: 14.3 % (ref 11.5–15.5)
WBC: 6.3 10*3/uL (ref 4.0–10.5)
nRBC: 0 % (ref 0.0–0.2)

## 2022-05-20 MED ORDER — IBUPROFEN 800 MG PO TABS
800.0000 mg | ORAL_TABLET | Freq: Once | ORAL | Status: AC
  Administered 2022-05-20: 800 mg via ORAL
  Filled 2022-05-20: qty 1

## 2022-05-20 NOTE — ED Provider Notes (Signed)
[facility] DEPT Provider Note   CSN: [id number] Arrival date & time: 05/20/22  1714     History  Chief Complaint  Patient presents with   Leg Pain   Leg Swelling   HPI Christine Rose is a 31 y.o. female with history of bilateral tibia fractures in 2017 presenting for bilateral leg pain, numbness and weakness.  Symptoms started couple months ago in both legs.  Symptoms come and go.  Patient states that at times she can walk normally without pain.  But when her symptoms return she experiences pain in both legs that starts at the top of her shin and extends down to her feet\long with her numbness and tingling which can last hours sometimes days.  Endorses significant reduced mobility.  Denies recent viral or bacterial illness.  Denies chest pain shortness of breath.  Has not taken any medications for symptoms.    Leg Pain      Home Medications Prior to Admission medications   Not on File      Allergies    Patient has no known allergies.    Review of Systems   Review of Systems  Neurological:  Positive for weakness.    Physical Exam Updated Vital Signs BP (!) 126/92 (BP Location: Left Arm)   Pulse 80   Temp 98.2 F (36.8 C) (Oral)   Resp 18   Ht 5\' 5"  (1.651 m)   Wt 66.2 kg   LMP 05/07/2022 (Approximate)   SpO2 100%   BMI 24.30 kg/m  Physical Exam Vitals and nursing note reviewed.  HENT:     Head: Normocephalic and atraumatic.     Mouth/Throat:     Mouth: Mucous membranes are moist.  Eyes:     General:        Right eye: No discharge.        Left eye: No discharge.     Conjunctiva/sclera: Conjunctivae normal.  Cardiovascular:     Rate and Rhythm: Normal rate and regular rhythm.     Pulses: Normal pulses.     Heart sounds: Normal heart sounds.  Pulmonary:     Effort: Pulmonary effort is normal.     Breath sounds: Normal breath sounds.  Abdominal:     General: Abdomen is flat.     Palpations: Abdomen is soft.   Musculoskeletal:     Right lower leg: Tenderness present. No swelling, deformity, lacerations or bony tenderness.     Left lower leg: Tenderness present. No swelling, deformity, lacerations or bony tenderness.     Comments: Range of motion limited bilaterally in the knees ankles and toes.  Minimal flexion extension of the toes.  Could not invert or evert flex or extend the ankles.  Able to extend and flex the knee.  Skin:    General: Skin is warm.  Neurological:     General: No focal deficit present.     Deep Tendon Reflexes:     Reflex Scores:      Patellar reflexes are 2+ on the right side and 2+ on the left side.      Achilles reflexes are 2+ on the right side and 2+ on the left side. Psychiatric:        Mood and Affect: Mood normal.     ED Results / Procedures / Treatments   Labs (all labs ordered are listed, but only abnormal results are displayed) Labs Reviewed  BASIC METABOLIC PANEL - Abnormal; Notable for the following components:  Result Value   Calcium 8.6 (*)    All other components within normal limits  CBC WITH DIFFERENTIAL/PLATELET - Abnormal; Notable for the following components:   Hemoglobin 11.8 (*)    All other components within normal limits    EKG None  Radiology No results found.  Procedures Procedures    Medications Ordered in ED Medications  ibuprofen (ADVIL) tablet 800 mg (800 mg Oral Given 05/20/22 1825)    ED Course/ Medical Decision Making/ A&P                           Medical Decision Making  Patient presented for bilateral leg weakness.  On exam, during gait assessment she used her hands to lift her legs as she tried to walk.  Was able to bear weight.  Reflexes were 2+ bilaterally.  With normal Babinski sign.  Differential for this plate includes Gilliam Barr syndrome, multiple sclerosis, DVT, and electrolyte derangement.  Doubt Katheran Awe due to the intermittent nature of her weakness and numbness and no recent viral or  bacterial illness per patient.  Doubt DVT as pain is bilateral no evidence or swelling or erythematous on exam.  Doubt electrolyte derangement given reassuring BMP.  At this time cannot rule out multiple sclerosis.  Discussed with patient that she should follow-up with neurology given her intermittent bilateral weakness and numbness of her legs.  Discussed return precautions. Provided referral to outpatient neurology.        Final Clinical Impression(s) / ED Diagnoses Final diagnoses:  Weakness  Pain in both lower extremities    Rx / DC Orders ED Discharge Orders     None         Vaughan Browner 05/20/22 Brunilda Payor, MD 05/20/22 2340

## 2022-05-20 NOTE — ED Triage Notes (Signed)
Patient c/o bilateral leg pain and swelling x years and states that it has been getting worse for the past few months.

## 2022-05-20 NOTE — Discharge Instructions (Addendum)
Evaluation for your bilateral leg weakness and pain is overall reassuring however I do recommend that you be evaluated for possible multiple sclerosis.  Please follow-up with a neurologist and here in the area.  I have provided his contact information in your discharge summary.  If you have new swelling, asymmetric pain in your calves, new shortness of breath or chest pain please return to the emergency department for further evaluation.

## 2022-11-14 ENCOUNTER — Encounter (HOSPITAL_COMMUNITY): Payer: Self-pay

## 2022-11-14 ENCOUNTER — Inpatient Hospital Stay (HOSPITAL_COMMUNITY): Payer: Medicaid Other

## 2022-11-14 ENCOUNTER — Inpatient Hospital Stay (HOSPITAL_COMMUNITY)
Admission: AD | Admit: 2022-11-14 | Discharge: 2022-11-14 | Disposition: A | Discharge status: Home / Self Care | Payer: Medicaid Other | Attending: Family Medicine | Admitting: Family Medicine

## 2022-11-14 DIAGNOSIS — Z3A01 Less than 8 weeks gestation of pregnancy: Secondary | ICD-10-CM

## 2022-11-14 DIAGNOSIS — O26891 Other specified pregnancy related conditions, first trimester: Secondary | ICD-10-CM | POA: Diagnosis not present

## 2022-11-14 DIAGNOSIS — K5900 Constipation, unspecified: Secondary | ICD-10-CM | POA: Insufficient documentation

## 2022-11-14 DIAGNOSIS — O208 Other hemorrhage in early pregnancy: Secondary | ICD-10-CM | POA: Insufficient documentation

## 2022-11-14 DIAGNOSIS — O99611 Diseases of the digestive system complicating pregnancy, first trimester: Secondary | ICD-10-CM | POA: Insufficient documentation

## 2022-11-14 DIAGNOSIS — R109 Unspecified abdominal pain: Secondary | ICD-10-CM

## 2022-11-14 DIAGNOSIS — O26899 Other specified pregnancy related conditions, unspecified trimester: Secondary | ICD-10-CM

## 2022-11-14 DIAGNOSIS — R1031 Right lower quadrant pain: Secondary | ICD-10-CM | POA: Diagnosis present

## 2022-11-14 DIAGNOSIS — O219 Vomiting of pregnancy, unspecified: Secondary | ICD-10-CM

## 2022-11-14 DIAGNOSIS — O418X11 Other specified disorders of amniotic fluid and membranes, first trimester, fetus 1: Secondary | ICD-10-CM

## 2022-11-14 DIAGNOSIS — O468X1 Other antepartum hemorrhage, first trimester: Secondary | ICD-10-CM

## 2022-11-14 LAB — CBC
HCT: 37.4 % (ref 36.0–46.0)
Hemoglobin: 11.9 g/dL — ABNORMAL LOW (ref 12.0–15.0)
MCH: 26.4 pg (ref 26.0–34.0)
MCHC: 31.8 g/dL (ref 30.0–36.0)
MCV: 83.1 fL (ref 80.0–100.0)
Platelets: 281 10*3/uL (ref 150–400)
RBC: 4.5 MIL/uL (ref 3.87–5.11)
RDW: 13.6 % (ref 11.5–15.5)
WBC: 5.2 10*3/uL (ref 4.0–10.5)
nRBC: 0 % (ref 0.0–0.2)

## 2022-11-14 LAB — URINALYSIS, ROUTINE W REFLEX MICROSCOPIC
Bilirubin Urine: NEGATIVE
Glucose, UA: NEGATIVE mg/dL
Hgb urine dipstick: NEGATIVE
Ketones, ur: NEGATIVE mg/dL
Leukocytes,Ua: NEGATIVE
Nitrite: NEGATIVE
Protein, ur: NEGATIVE mg/dL
Specific Gravity, Urine: 1.011 (ref 1.005–1.030)
pH: 7 (ref 5.0–8.0)

## 2022-11-14 LAB — WET PREP, GENITAL
Clue Cells Wet Prep HPF POC: NONE SEEN
Sperm: NONE SEEN
Trich, Wet Prep: NONE SEEN
WBC, Wet Prep HPF POC: >=10 — AB (ref <10)
Yeast Wet Prep HPF POC: NONE SEEN

## 2022-11-14 LAB — HCG, QUANTITATIVE, PREGNANCY: hCG, Beta Chain, Quant, S: 78769 m[IU]/mL — ABNORMAL HIGH (ref <5)

## 2022-11-14 LAB — POCT PREGNANCY, URINE: Preg Test, Ur: POSITIVE — AB

## 2022-11-14 MED ORDER — DOXYLAMINE SUCCINATE (SLEEP) 25 MG PO TABS: 25.0000 mg | ORAL_TABLET | Freq: Every evening | ORAL | 0 refills | Status: DC | PRN

## 2022-11-14 MED ORDER — POLYETHYLENE GLYCOL 3350 17 G PO PACK
17.0000 g | PACK | Freq: Once | ORAL | Status: AC
  Administered 2022-11-14: 17 g via ORAL
  Filled 2022-11-14 (×3): qty 1

## 2022-11-14 MED ORDER — PROMETHAZINE HCL 25 MG PO TABS
25.0000 mg | ORAL_TABLET | Freq: Once | ORAL | Status: AC
  Administered 2022-11-14: 25 mg via ORAL
  Filled 2022-11-14: qty 1

## 2022-11-14 MED ORDER — PRENATAL VITAMINS 28-0.8 MG PO TABS: 1.0000 | ORAL_TABLET | Freq: Every day | ORAL | 2 refills | Status: DC

## 2022-11-14 MED ORDER — VITAMIN B-6 25 MG PO TABS: 25.0000 mg | ORAL_TABLET | Freq: Three times a day (TID) | ORAL | 1 refills | Status: DC | PRN

## 2022-11-14 NOTE — MAU Note (Signed)
.  Christine Rose is a 32 y.o. at 106w6d here in MAU reporting: yesterday evening she began having lower right sided abdominal pain that is sharp. Denies VB. Has had an increase in normal discharge.  LMP: 09/27/22 Onset of complaint: 11/13/22 Pain score: 9 Vitals:   11/14/22 1552  BP: 117/71  Pulse: 63  Resp: 14  Temp: 98.1 F (36.7 C)  SpO2: 100%     FHT:n/a Lab orders placed from triage:  UPT, UA

## 2022-11-14 NOTE — MAU Provider Note (Signed)
History     [id number]  Arrival date and time: 11/14/22 1541    Chief Complaint  Patient presents with   Abdominal Pain   HPI Christine Rose is a 32 y.o. at [redacted]w[redacted]d by LMP who presents with right lower quadrant pain.  Onset yesterday, feels like a constant sharp pain.  Worse with getting up, better when laying down.  Also has significant nausea and some vomiting.  She has had no vaginal bleeding.  No vaginal discharge.  No prior history of ectopic pregnancy.  Yet to have an ultrasound in this pregnancy.  Last bowel movement was 2 days ago, stools relatively hard and less frequent in the last couple weeks.  OB History     Gravida  4   Para  2   Term  2   Preterm  0   AB  1   Living  2      SAB  1   IAB  0   Ectopic  0   Multiple  0   Live Births  2           Past Medical History:  Diagnosis Date   Allergic rhinitis    ASCUS (atypical squamous cells of undetermined significance) on Pap smear 01/08/2012   hpv pos,follow up pap 09-21-12 Ascus, pap 05-20-14 Neg   Chlamydia 2010 and 10/06/15   Gonorrhea 12/05/2017   History of chicken pox    Osgood-Schlatter's disease    hx   Vaginal Pap smear, abnormal     Past Surgical History:  Procedure Laterality Date   NO PAST SURGERIES      Family History  Problem Relation Age of Onset   Other Mother        large ASD declining surgical intervention   Stroke Mother 18   Breast cancer Maternal Grandmother 55   Diabetes Maternal Grandmother    Hypertension Maternal Grandmother    Diabetes Maternal Grandfather    Hypertension Maternal Grandfather    Diabetes Paternal Grandmother    Hypertension Paternal Grandmother    Diabetes Paternal Grandfather    Hypertension Paternal Grandfather     Social History   Socioeconomic History   Marital status: Married    Spouse name: Not on file   Number of children: Not on file   Years of education: Not on file   Highest education level: Not on file  Occupational  History   Not on file  Tobacco Use   Smoking status: Never   Smokeless tobacco: Never  Vaping Use   Vaping Use: Never used  Substance and Sexual Activity   Alcohol use: No    Alcohol/week: 0.0 standard drinks of alcohol   Drug use: No   Sexual activity: Yes    Partners: Male    Birth control/protection: None  Other Topics Concern   Not on file  Social History Narrative   Nonsmoker no alcohol    Hhof 4 no pets   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   As off because of work schedule.   Negative TAD   Avoids milk    Social Determinants of Health   Financial Resource Strain: Low Risk  (07/06/2018)   Overall Financial Resource Strain (CARDIA)    Difficulty of Paying Living Expenses: Not very hard  Food Insecurity: Unknown (07/06/2018)   Hunger Vital Sign  Worried About Charity fundraiser in the Last Year: Patient declined    Arboriculturist in the Last Year: Patient declined  Transportation Needs: Unknown (07/06/2018)   [place] - Hydrologist (Medical): Patient declined    Lack of Transportation (Non-Medical): Patient declined  Physical Activity: Insufficiently Active (07/06/2018)   Exercise Vital Sign    Days of Exercise per Week: 2 days    Minutes of Exercise per Session: 60 min  Stress: No Stress Concern Present (07/06/2018)   [place]    Feeling of Stress : Only a little  Social Connections: Not on file  Intimate Partner Violence: Unknown (07/06/2018)   Humiliation, Afraid, Rape, and Kick questionnaire    Fear of Current or Ex-Partner: Patient declined    Emotionally Abused: Patient declined    Physically Abused: Patient declined    Sexually Abused: Patient declined    No Known Allergies  No current facility-administered medications on file prior  to encounter.   No current outpatient medications on file prior to encounter.     Review of Systems  Constitutional:  Negative for chills and fever.  Eyes:  Negative for blurred vision.  Cardiovascular:  Negative for leg swelling.  Gastrointestinal:  Positive for abdominal pain, nausea and vomiting.  Genitourinary:  Negative for dysuria, frequency, hematuria and urgency.  Musculoskeletal:  Negative for myalgias.  Skin:  Negative for itching.  Neurological:  Negative for dizziness and headaches.   Pertinent positives and negative per HPI, all others reviewed and negative  Physical Exam   BP 117/71 (BP Location: Right Arm)   Pulse 63   Temp 98.1 F (36.7 C) (Oral)   Resp 14   Ht 5\' 4"  (1.626 m)   Wt 66.7 kg   LMP 09/27/2022   SpO2 100%   BMI 25.25 kg/m   Patient Vitals for the past 24 hrs:  BP Temp Temp src Pulse Resp SpO2 Height Weight  11/14/22 1552 117/71 98.1 F (36.7 C) Oral 63 14 100 % 5\' 4"  (1.626 m) 66.7 kg    Physical Exam Vitals reviewed.  Constitutional:      General: She is not in acute distress.    Appearance: She is well-developed. She is not toxic-appearing.  HENT:     Head: Normocephalic and atraumatic.     Mouth/Throat:     Mouth: Mucous membranes are moist.  Eyes:     Extraocular Movements: Extraocular movements intact.  Cardiovascular:     Rate and Rhythm: Normal rate.  Pulmonary:     Effort: Pulmonary effort is normal. No respiratory distress.  Abdominal:     General: There is distension (mild, feels gassy).     Palpations: Abdomen is soft. There is no mass.     Tenderness: There is abdominal tenderness in the right lower quadrant. There is no guarding or rebound.  Skin:    General: Skin is warm and dry.  Neurological:     Mental Status: She is alert and oriented to person, place, and time.  Psychiatric:        Mood and Affect: Mood normal.        Behavior: Behavior normal.    Labs Results for orders placed or performed during the  hospital encounter of 11/14/22 (from the past 24 hour(s))  Pregnancy, urine POC     Status: Abnormal   Collection Time: 11/14/22  3:49 PM  Result Value Ref Range  Preg Test, Ur POSITIVE (A) NEGATIVE  hCG, quantitative, pregnancy     Status: Abnormal   Collection Time: 11/14/22  4:21 PM  Result Value Ref Range   hCG, Beta Chain, Quant, S 78,769 (H) <5 mIU/mL  CBC     Status: Abnormal   Collection Time: 11/14/22  4:21 PM  Result Value Ref Range   WBC 5.2 4.0 - 10.5 K/uL   RBC 4.50 3.87 - 5.11 MIL/uL   Hemoglobin 11.9 (L) 12.0 - 15.0 g/dL   HCT 37.4 36.0 - 46.0 %   MCV 83.1 80.0 - 100.0 fL   MCH 26.4 26.0 - 34.0 pg   MCHC 31.8 30.0 - 36.0 g/dL   RDW 13.6 11.5 - 15.5 %   Platelets 281 150 - 400 K/uL   nRBC 0.0 0.0 - 0.2 %  Urinalysis, Routine w reflex microscopic -Urine, Clean Catch     Status: None   Collection Time: 11/14/22  4:24 PM  Result Value Ref Range   Color, Urine YELLOW YELLOW   APPearance CLEAR CLEAR   Specific Gravity, Urine 1.011 1.005 - 1.030   pH 7.0 5.0 - 8.0   Glucose, UA NEGATIVE NEGATIVE mg/dL   Hgb urine dipstick NEGATIVE NEGATIVE   Bilirubin Urine NEGATIVE NEGATIVE   Ketones, ur NEGATIVE NEGATIVE mg/dL   Protein, ur NEGATIVE NEGATIVE mg/dL   Nitrite NEGATIVE NEGATIVE   Leukocytes,Ua NEGATIVE NEGATIVE  Wet prep, genital     Status: Abnormal   Collection Time: 11/14/22  4:40 PM   Specimen: Vaginal  Result Value Ref Range   Yeast Wet Prep HPF POC NONE SEEN NONE SEEN   Trich, Wet Prep NONE SEEN NONE SEEN   Clue Cells Wet Prep HPF POC NONE SEEN NONE SEEN   WBC, Wet Prep HPF POC >=10 (A) <10   Sperm NONE SEEN     Imaging US OB Comp Less 14 Wks  Result Date: 11/14/2022 CLINICAL DATA:  [id number] Pregnancy, location unknown [id number] Right lower quadrant abdominal pain [id number] EXAM: OBSTETRIC <14 WK ULTRASOUND TECHNIQUE: Ob ultrasound was performed for complete evaluation of the gestation as well as the maternal uterus, adnexal regions, and pelvic  cul-de-sac. COMPARISON:  None Available. FINDINGS: Intrauterine gestational sac: Single Yolk sac:  Visualized. Embryo:  Visualized. Cardiac Activity: Visualized. Heart Rate: 141 bpm CRL:   0.9 cm = 6 weeks 6 days [place] EDC: 07/04/2023 Subchorionic hemorrhage: 1 cm collection consistent with a hematoma. Adnexa: No masses or fluid collections. IMPRESSION: 1. Single viable intrauterine pregnancy measuring 6 weeks 6 days with ultrasound [facility] 07/04/2023. 2. No adnexal pathology. 3. Small subchorionic hematoma. Electronically Signed   By: Sammie Bench M.D.   On: 11/14/2022 16:46    MAU Course  Procedures Lab Orders         Wet prep, genital         Urinalysis, Routine w reflex microscopic -Urine, Clean Catch         hCG, quantitative, pregnancy         CBC         Pregnancy, urine POC    Meds ordered this encounter  Medications   promethazine (PHENERGAN) tablet 25 mg   polyethylene glycol (MIRALAX / GLYCOLAX) packet 17 g   Prenatal Vit-Fe Fumarate-FA (PRENATAL VITAMINS) 28-0.8 MG TABS    Sig: Take 1 tablet by mouth daily.    Dispense:  90 tablet    Refill:  2   pyridOXINE (VITAMIN B6) 25 MG tablet    Sig: Take  1 tablet (25 mg total) by mouth every 8 (eight) hours as needed (nausea).    Dispense:  60 tablet    Refill:  1   doxylamine, Sleep, (UNISOM) 25 MG tablet    Sig: Take 1 tablet (25 mg total) by mouth at bedtime as needed (nausea). Take along with pyridoxine    Dispense:  30 tablet    Refill:  0   Imaging Orders         US OB Comp Less 14 Wks     MDM moderate  Assessment and Plan  Abdominal pain affecting pregnancy  Subchorionic hematoma in first trimester, fetus 1 of multiple gestation  Constipation, unspecified constipation type  Nausea/vomiting in pregnancy  Abdominal pain is likely due to constipation. Recommended increasing fibre in her diet. Can add in OTC stools softener and drink lots of fluids. [place] confirms dates consistent with LMP at 109w6d, small subchorionic  hematoma, but otherwise viable intrauterine embyro. bHCG are consistent.  List provided for obgyn offices in the area. She will call and establish care.  Dispo: discharged to home in stable condition  Allergies as of 11/14/2022   No Known Allergies      Medication List     TAKE these medications    doxylamine (Sleep) 25 MG tablet Commonly known as: UNISOM Take 1 tablet (25 mg total) by mouth at bedtime as needed (nausea). Take along with pyridoxine   Prenatal Vitamins 28-0.8 MG Tabs Take 1 tablet by mouth daily.   pyridOXINE 25 MG tablet Commonly known as: VITAMIN B6 Take 1 tablet (25 mg total) by mouth every 8 (eight) hours as needed (nausea).       Liliane Channel MD MPH OB Fellow, [place] for [place] 11/14/2022

## 2022-11-26 ENCOUNTER — Inpatient Hospital Stay (HOSPITAL_COMMUNITY)
Admission: AD | Admit: 2022-11-26 | Discharge: 2022-11-26 | Disposition: A | Discharge status: Home / Self Care | Payer: Medicaid Other | Attending: Obstetrics and Gynecology | Admitting: Obstetrics and Gynecology

## 2022-11-26 ENCOUNTER — Encounter (HOSPITAL_COMMUNITY): Payer: Self-pay | Admitting: Obstetrics and Gynecology

## 2022-11-26 DIAGNOSIS — K117 Disturbances of salivary secretion: Secondary | ICD-10-CM | POA: Diagnosis not present

## 2022-11-26 DIAGNOSIS — E876 Hypokalemia: Secondary | ICD-10-CM | POA: Insufficient documentation

## 2022-11-26 DIAGNOSIS — O99611 Diseases of the digestive system complicating pregnancy, first trimester: Secondary | ICD-10-CM | POA: Diagnosis not present

## 2022-11-26 DIAGNOSIS — E860 Dehydration: Secondary | ICD-10-CM

## 2022-11-26 DIAGNOSIS — O219 Vomiting of pregnancy, unspecified: Secondary | ICD-10-CM | POA: Diagnosis not present

## 2022-11-26 DIAGNOSIS — O99281 Endocrine, nutritional and metabolic diseases complicating pregnancy, first trimester: Secondary | ICD-10-CM | POA: Diagnosis not present

## 2022-11-26 DIAGNOSIS — R112 Nausea with vomiting, unspecified: Secondary | ICD-10-CM | POA: Diagnosis present

## 2022-11-26 DIAGNOSIS — Z3A08 8 weeks gestation of pregnancy: Secondary | ICD-10-CM | POA: Insufficient documentation

## 2022-11-26 LAB — COMPREHENSIVE METABOLIC PANEL
ALT: 21 U/L (ref 0–44)
AST: 16 U/L (ref 15–41)
Albumin: 3.4 g/dL — ABNORMAL LOW (ref 3.5–5.0)
Alkaline Phosphatase: 48 U/L (ref 38–126)
Anion gap: 8 (ref 5–15)
BUN: 7 mg/dL (ref 6–20)
CO2: 21 mmol/L — ABNORMAL LOW (ref 22–32)
Calcium: 8.3 mg/dL — ABNORMAL LOW (ref 8.9–10.3)
Chloride: 106 mmol/L (ref 98–111)
Creatinine, Ser: 0.77 mg/dL (ref 0.44–1.00)
GFR, Estimated: >60 mL/min (ref >60)
Glucose, Bld: 85 mg/dL (ref 70–99)
Potassium: 3 mmol/L — ABNORMAL LOW (ref 3.5–5.1)
Sodium: 135 mmol/L (ref 135–145)
Total Bilirubin: 0.6 mg/dL (ref 0.3–1.2)
Total Protein: 6.9 g/dL (ref 6.5–8.1)

## 2022-11-26 LAB — URINALYSIS, ROUTINE W REFLEX MICROSCOPIC
Bacteria, UA: NONE SEEN
Bilirubin Urine: NEGATIVE
Glucose, UA: NEGATIVE mg/dL
Hgb urine dipstick: NEGATIVE
Ketones, ur: 80 mg/dL — AB
Nitrite: NEGATIVE
Protein, ur: 30 mg/dL — AB
Specific Gravity, Urine: 1.032 — ABNORMAL HIGH (ref 1.005–1.030)
pH: 5 (ref 5.0–8.0)

## 2022-11-26 LAB — CBC
HCT: 37 % (ref 36.0–46.0)
Hemoglobin: 12 g/dL (ref 12.0–15.0)
MCH: 27.1 pg (ref 26.0–34.0)
MCHC: 32.4 g/dL (ref 30.0–36.0)
MCV: 83.7 fL (ref 80.0–100.0)
Platelets: 258 10*3/uL (ref 150–400)
RBC: 4.42 MIL/uL (ref 3.87–5.11)
RDW: 13.8 % (ref 11.5–15.5)
WBC: 6 10*3/uL (ref 4.0–10.5)
nRBC: 0 % (ref 0.0–0.2)

## 2022-11-26 MED ORDER — LACTATED RINGERS IV BOLUS
1000.0000 mL | Freq: Once | INTRAVENOUS | Status: AC
  Administered 2022-11-26: 1000 mL via INTRAVENOUS

## 2022-11-26 MED ORDER — ONDANSETRON 4 MG PO TBDP: 4.0000 mg | ORAL_TABLET | Freq: Four times a day (QID) | ORAL | 0 refills | Status: DC | PRN

## 2022-11-26 MED ORDER — SODIUM CHLORIDE 0.9 % IV SOLN
25.0000 mg | Freq: Once | INTRAVENOUS | Status: AC
  Administered 2022-11-26: 25 mg via INTRAVENOUS
  Filled 2022-11-26: qty 1

## 2022-11-26 MED ORDER — FAMOTIDINE IN NACL 20-0.9 MG/50ML-% IV SOLN
20.0000 mg | Freq: Once | INTRAVENOUS | Status: AC
  Administered 2022-11-26: 20 mg via INTRAVENOUS
  Filled 2022-11-26: qty 50

## 2022-11-26 MED ORDER — SCOPOLAMINE 1 MG/3DAYS TD PT72
1.0000 | MEDICATED_PATCH | TRANSDERMAL | Status: DC
  Administered 2022-11-26: 1.5 mg via TRANSDERMAL
  Filled 2022-11-26: qty 1

## 2022-11-26 MED ORDER — POTASSIUM CHLORIDE ER 10 MEQ PO TBCR: 10.0000 meq | EXTENDED_RELEASE_TABLET | Freq: Every day | ORAL | 0 refills | Status: DC

## 2022-11-26 MED ORDER — PROMETHAZINE HCL 25 MG PO TABS: 25.0000 mg | ORAL_TABLET | Freq: Four times a day (QID) | ORAL | 2 refills | Status: DC | PRN

## 2022-11-26 MED ORDER — GLYCOPYRROLATE 0.2 MG/ML IJ SOLN
0.1000 mg | Freq: Once | INTRAMUSCULAR | Status: AC
  Administered 2022-11-26: 0.1 mg via INTRAVENOUS
  Filled 2022-11-26: qty 1

## 2022-11-26 MED ORDER — GLYCOPYRROLATE 1 MG PO TABS: 1.0000 mg | ORAL_TABLET | Freq: Three times a day (TID) | ORAL | 0 refills | Status: DC | PRN

## 2022-11-26 NOTE — MAU Provider Note (Cosign Needed Addendum)
History     CSN: [id number]  Arrival date and time: 11/26/22 1631   Event Date/Time   First Provider Initiated Contact with Patient 11/26/22 1908      Chief Complaint  Patient presents with   Fatigue   Nausea   Emesis   Ptyalism   31 y.o. [id number] @[redacted]w[redacted]d  with conformed IUP presenting with N/V and spitting. States she was unable to tolerate solid or liquids today. She was tolerating po yesterday. Also reports excessive spitting. Endorses upper abdominal pain. Rates 9/10. Endorses HA, dizziness, and weakness.     OB History     Gravida  4   Para  2   Term  2   Preterm  0   AB  1   Living  2      SAB  1   IAB  0   Ectopic  0   Multiple  0   Live Births  2           Past Medical History:  Diagnosis Date   Allergic rhinitis    ASCUS (atypical squamous cells of undetermined significance) on Pap smear 01/08/2012   hpv pos,follow up pap 09-21-12 Ascus, pap 05-20-14 Neg   Chlamydia 2010 and 10/06/15   Gonorrhea 12/05/2017   History of chicken pox    Osgood-Schlatter's disease    hx   Vaginal Pap smear, abnormal     Past Surgical History:  Procedure Laterality Date   NO PAST SURGERIES      Family History  Problem Relation Age of Onset   Other Mother        large ASD declining surgical intervention   Stroke Mother 30   Breast cancer Maternal Grandmother 55   Diabetes Maternal Grandmother    Hypertension Maternal Grandmother    Diabetes Maternal Grandfather    Hypertension Maternal Grandfather    Diabetes Paternal Grandmother    Hypertension Paternal Grandmother    Diabetes Paternal Grandfather    Hypertension Paternal Grandfather     Social History   Tobacco Use   Smoking status: Never   Smokeless tobacco: Never  Vaping Use   Vaping Use: Never used  Substance Use Topics   Alcohol use: No    Alcohol/week: 0.0 standard drinks of alcohol   Drug use: No    Allergies: No Known Allergies  Medications Prior to Admission  Medication Sig  Dispense Refill Last Dose   doxylamine, Sleep, (UNISOM) 25 MG tablet Take 1 tablet (25 mg total) by mouth at bedtime as needed (nausea). Take along with pyridoxine 30 tablet 0    Prenatal Vit-Fe Fumarate-FA (PRENATAL VITAMINS) 28-0.8 MG TABS Take 1 tablet by mouth daily. 90 tablet 2    pyridOXINE (VITAMIN B6) 25 MG tablet Take 1 tablet (25 mg total) by mouth every 8 (eight) hours as needed (nausea). 60 tablet 1     Review of Systems  Gastrointestinal:  Positive for abdominal pain, nausea and vomiting.  Genitourinary:  Negative for vaginal bleeding.  Neurological:  Positive for dizziness, weakness and headaches.   Physical Exam   Blood pressure 111/74, pulse 92, temperature 99.1 F (37.3 C), temperature source Oral, resp. rate 14, height 5\' 4"  (1.626 m), weight 65.2 kg, last menstrual period 09/27/2022, SpO2 100 %, unknown if currently breastfeeding.  Physical Exam Vitals and nursing note reviewed.  Constitutional:      General: She is not in acute distress (spitting multiple times into bag).    Appearance: Normal appearance.  HENT:  Head: Normocephalic and atraumatic.  Cardiovascular:     Rate and Rhythm: Normal rate.  Pulmonary:     Effort: Pulmonary effort is normal. No respiratory distress.  Abdominal:     General: There is no distension.     Palpations: Abdomen is soft. There is no mass.     Tenderness: There is no abdominal tenderness. There is no guarding or rebound.     Hernia: No hernia is present.  Musculoskeletal:        General: Normal range of motion.     Cervical back: Normal range of motion.  Skin:    General: Skin is warm and dry.  Neurological:     General: No focal deficit present.     Mental Status: She is alert and oriented to person, place, and time.  Psychiatric:        Mood and Affect: Mood normal.        Behavior: Behavior normal.    Results for orders placed or performed during the hospital encounter of 11/26/22 (from the past 24 hour(s))   Urinalysis, Routine w reflex microscopic -Urine, Clean Catch     Status: Abnormal   Collection Time: 11/26/22  6:13 PM  Result Value Ref Range   Color, Urine AMBER (A) YELLOW   APPearance HAZY (A) CLEAR   Specific Gravity, Urine 1.032 (H) 1.005 - 1.030   pH 5.0 5.0 - 8.0   Glucose, UA NEGATIVE NEGATIVE mg/dL   Hgb urine dipstick NEGATIVE NEGATIVE   Bilirubin Urine NEGATIVE NEGATIVE   Ketones, ur 80 (A) NEGATIVE mg/dL   Protein, ur 30 (A) NEGATIVE mg/dL   Nitrite NEGATIVE NEGATIVE   Leukocytes,Ua TRACE (A) NEGATIVE   RBC / HPF 0-5 0 - 5 RBC/hpf   WBC, UA 6-10 0 - 5 WBC/hpf   Bacteria, UA NONE SEEN NONE SEEN   Squamous Epithelial / HPF 0-5 0 - 5 /HPF   Mucus PRESENT     MAU Course  Procedures LR Pepcid Phenergan  MDM Labs ordered. Transfer of care given to Lorenda Peck, CNM  11/26/2022 8:08 PM  Results for orders placed or performed during the hospital encounter of 11/26/22 (from the past 24 hour(s))  Urinalysis, Routine w reflex microscopic -Urine, Clean Catch     Status: Abnormal   Collection Time: 11/26/22  6:13 PM  Result Value Ref Range   Color, Urine AMBER (A) YELLOW   APPearance HAZY (A) CLEAR   Specific Gravity, Urine 1.032 (H) 1.005 - 1.030   pH 5.0 5.0 - 8.0   Glucose, UA NEGATIVE NEGATIVE mg/dL   Hgb urine dipstick NEGATIVE NEGATIVE   Bilirubin Urine NEGATIVE NEGATIVE   Ketones, ur 80 (A) NEGATIVE mg/dL   Protein, ur 30 (A) NEGATIVE mg/dL   Nitrite NEGATIVE NEGATIVE   Leukocytes,Ua TRACE (A) NEGATIVE   RBC / HPF 0-5 0 - 5 RBC/hpf   WBC, UA 6-10 0 - 5 WBC/hpf   Bacteria, UA NONE SEEN NONE SEEN   Squamous Epithelial / HPF 0-5 0 - 5 /HPF   Mucus PRESENT   CBC     Status: None   Collection Time: 11/26/22  6:58 PM  Result Value Ref Range   WBC 6.0 4.0 - 10.5 K/uL   RBC 4.42 3.87 - 5.11 MIL/uL   Hemoglobin 12.0 12.0 - 15.0 g/dL   HCT 96.2 95.2 - 84.1 %   MCV 83.7 80.0 - 100.0 fL   MCH 27.1 26.0 - 34.0 pg   MCHC 32.4 30.0 -  36.0 g/dL    RDW 56.3 87.5 - 64.3 %   Platelets 258 150 - 400 K/uL   nRBC 0.0 0.0 - 0.2 %  Comprehensive metabolic panel     Status: Abnormal   Collection Time: 11/26/22  6:58 PM  Result Value Ref Range   Sodium 135 135 - 145 mmol/L   Potassium 3.0 (L) 3.5 - 5.1 mmol/L   Chloride 106 98 - 111 mmol/L   CO2 21 (L) 22 - 32 mmol/L   Glucose, Bld 85 70 - 99 mg/dL   BUN 7 6 - 20 mg/dL   Creatinine, Ser 3.29 0.44 - 1.00 mg/dL   Calcium 8.3 (L) 8.9 - 10.3 mg/dL   Total Protein 6.9 6.5 - 8.1 g/dL   Albumin 3.4 (L) 3.5 - 5.0 g/dL   AST 16 15 - 41 U/L   ALT 21 0 - 44 U/L   Alkaline Phosphatase 48 38 - 126 U/L   Total Bilirubin 0.6 0.3 - 1.2 mg/dL   GFR, Estimated >51 >88 mL/min   Anion gap 8 5 - 15   Felt better after medications and fluids Tolerated PO intake Will discharge home with meds to take as needed  Assessment and Plan  [place] IUP at [redacted]w[redacted]d Nausea and vomiting  Dehydration Ptyalism Mild Hypokalemia due to vomiting  Discharge home Rx Phenergan for use at night for nausea Rx Zofran for nausea when cannot take Phenergan Advise to continue B6 Rx Robinul for ptyalism Rx KDur x 3 days Advance diet as tolerated Encouraged to return if she develops worsening of symptoms, increase in pain, fever, or other concerning symptoms.   Aviva Signs, CNM

## 2022-11-26 NOTE — MAU Note (Signed)
.  Christine Rose is a 32 y.o. at [redacted]w[redacted]d here in MAU reporting: Nausea and vomiting all day today. She reports she feels weak and she has been feeling dizzy all day. She reports she has been struggling with spitting her entire pregnancy as well as the nausea and vomiting. She also endorses an anterior HA that began this morning as well.  Has not taken any medications. She reports she was told she was sent in nausea medication and colace it was not there.  Onset of complaint: Ongoing - worsening sx's today. Pain score: 9/10 HA  Lab orders placed from triage: UA

## 2022-12-03 ENCOUNTER — Ambulatory Visit: Payer: Self-pay | Admitting: Family Medicine

## 2022-12-12 ENCOUNTER — Ambulatory Visit: Payer: BLUE CROSS/BLUE SHIELD | Admitting: Family Medicine

## 2022-12-12 ENCOUNTER — Encounter: Payer: Self-pay | Admitting: Family Medicine

## 2022-12-12 VITALS — BP 100/60 | HR 80 | Temp 97.6°F | Ht 63.5 in | Wt 143.0 lb

## 2022-12-12 DIAGNOSIS — Z3491 Encounter for supervision of normal pregnancy, unspecified, first trimester: Secondary | ICD-10-CM

## 2022-12-12 DIAGNOSIS — Z3A00 Weeks of gestation of pregnancy not specified: Secondary | ICD-10-CM | POA: Diagnosis not present

## 2022-12-12 DIAGNOSIS — O219 Vomiting of pregnancy, unspecified: Secondary | ICD-10-CM

## 2022-12-12 NOTE — Assessment & Plan Note (Signed)
I advised the patient continue ondansetron 4 mg every 6 hours and promethazine 25 mg every 8 hours as needed for the nausea. This might improve over the next month as her pregnancy progresses. I advised that if we need to we can switch to the pormethazine rectal suppositories, pt states she is a little better and wants to wait and see if the nausea resolves. I advised she RTC after delivery of her infant. Until then the OB/GYN will be caring for her through the pregnancy.

## 2022-12-12 NOTE — Progress Notes (Signed)
New Patient Office Visit  Subjective    Patient ID: Christine Rose, female    DOB: April 26, 1991  Age: 32 y.o. MRN: [id number]  CC:  Chief Complaint  Patient presents with   Establish Care    HPI Christine Rose presents to establish care Patient is here for establish care. Is currently pregnant, is going to [place] for antenatal care. She reports this is her 4th pregnancy, has 2 live children, 1 misscarriage. States she was recently seen in the ER for severe nausea/ vomiting. She was given fluids, zofran and promethazine, antihistamines for sleep. She reports continued nausea at home despite using the medications she was given. States that there are some days she can't keep anything down, including the medications. Is trying her best to stay hydrated.   Patient is also reporting some constipation since she became pregnant, has used miralax for this with some success.   I have reviewed all aspects of the patient's medical history including social, family, and surgical history.   Current Outpatient Medications  Medication Instructions   doxylamine (Sleep) (UNISOM) 25 mg, Oral, At bedtime PRN, Take along with pyridoxine   glycopyrrolate (ROBINUL) 1 mg, Oral, Every 8 hours PRN   ondansetron (ZOFRAN-ODT) 4 mg, Oral, Every 6 hours PRN    Past Medical History:  Diagnosis Date   Allergic rhinitis    ASCUS (atypical squamous cells of undetermined significance) on Pap smear 01/08/2012   hpv pos,follow up pap 09-21-12 Ascus, pap 05-20-14 Neg   Chlamydia 2010 and 10/06/15   Gonorrhea 12/05/2017   History of chicken pox    Osgood-Schlatter's disease    hx   Vaginal Pap smear, abnormal     Past Surgical History:  Procedure Laterality Date   NO PAST SURGERIES      Family History  Problem Relation Age of Onset   Other Mother        large ASD declining surgical intervention   Stroke Mother 16   Breast cancer Maternal Grandmother 55   Diabetes Maternal Grandmother     Hypertension Maternal Grandmother    Diabetes Maternal Grandfather    Hypertension Maternal Grandfather    Diabetes Paternal Grandmother    Hypertension Paternal Grandmother    Diabetes Paternal Grandfather    Hypertension Paternal Grandfather     Social History   Socioeconomic History   Marital status: Married    Spouse name: Not on file   Number of children: Not on file   Years of education: Not on file   Highest education level: Not on file  Occupational History   Not on file  Tobacco Use   Smoking status: Never   Smokeless tobacco: Never  Vaping Use   Vaping Use: Never used  Substance and Sexual Activity   Alcohol use: No    Alcohol/week: 0.0 standard drinks of alcohol   Drug use: No   Sexual activity: Not Currently    Partners: Male    Birth control/protection: None  Other Topics Concern   Not on file  Social History Narrative   Nonsmoker no alcohol    Hhof 4 no pets   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 interested in obgynearea but may do PT assistant    No exercise except work.   As off because of work schedule.   Negative TAD   Avoids milk    Social Determinants of Health   Financial Resource Strain: Low  Risk  (07/06/2018)   Overall Financial Resource Strain (CARDIA)    Difficulty of Paying Living Expenses: Not very hard  Food Insecurity: Unknown (07/06/2018)   Hunger Vital Sign    Worried About Running Out of Food in the Last Year: Patient declined    Ran Out of Food in the Last Year: Patient declined  Transportation Needs: Unknown (07/06/2018)   PRAPARE - Administrator, Civil Service (Medical): Patient declined    Lack of Transportation (Non-Medical): Patient declined  Physical Activity: Insufficiently Active (07/06/2018)   Exercise Vital Sign    Days of Exercise per Week: 2 days    Minutes of Exercise per Session: 60 min  Stress: No Stress Concern Present (07/06/2018)   [employer] of  Occupational Health - Occupational Stress Questionnaire    Feeling of Stress : Only a little  Social Connections: Not on file  Intimate Partner Violence: Unknown (07/06/2018)   Humiliation, Afraid, Rape, and Kick questionnaire    Fear of Current or Ex-Partner: Patient declined    Emotionally Abused: Patient declined    Physically Abused: Patient declined    Sexually Abused: Patient declined    Review of Systems  All other systems reviewed and are negative.       Objective    BP 100/60 (BP Location: Left Arm, Patient Position: Sitting, Cuff Size: Normal)   Pulse 80   Temp 97.6 F (36.4 C) (Oral)   Ht 5' 3.5" (1.613 m)   Wt 143 lb (64.9 kg)   LMP 09/27/2022   SpO2 100%   BMI 24.93 kg/m   Physical Exam Vitals reviewed.  Constitutional:      Appearance: Normal appearance. She is well-groomed and normal weight.  Eyes:     Conjunctiva/sclera: Conjunctivae normal.  Neck:     Thyroid: No thyromegaly.  Cardiovascular:     Rate and Rhythm: Normal rate and regular rhythm.     Pulses: Normal pulses.     Heart sounds: S1 normal and S2 normal.  Pulmonary:     Effort: Pulmonary effort is normal.     Breath sounds: Normal breath sounds and air entry.  Abdominal:     General: Bowel sounds are normal.     Comments: Gravid uterus present just over the symphysis pubis  Musculoskeletal:     Right lower leg: No edema.     Left lower leg: No edema.  Neurological:     Mental Status: She is alert and oriented to person, place, and time. Mental status is at baseline.     Gait: Gait is intact.  Psychiatric:        Mood and Affect: Mood and affect normal.        Speech: Speech normal.        Behavior: Behavior normal.        Judgment: Judgment normal.     Last CBC Lab Results  Component Value Date   WBC 6.0 11/26/2022   HGB 12.0 11/26/2022   HCT 37.0 11/26/2022   MCV 83.7 11/26/2022   MCH 27.1 11/26/2022   RDW 13.8 11/26/2022   PLT 258 11/26/2022   Last metabolic  panel Lab Results  Component Value Date   GLUCOSE 85 11/26/2022   NA 135 11/26/2022   K 3.0 (L) 11/26/2022   CL 106 11/26/2022   CO2 21 (L) 11/26/2022   BUN 7 11/26/2022   CREATININE 0.77 11/26/2022   GFRNONAA >60 11/26/2022   CALCIUM 8.3 (L) 11/26/2022   PROT 6.9  11/26/2022   ALBUMIN 3.4 (L) 11/26/2022   BILITOT 0.6 11/26/2022   ALKPHOS 48 11/26/2022   AST 16 11/26/2022   ALT 21 11/26/2022   ANIONGAP 8 11/26/2022        Assessment & Plan:  Nausea and vomiting during pregnancy Assessment & Plan: I advised the patient continue ondansetron 4 mg every 6 hours and promethazine 25 mg every 8 hours as needed for the nausea. This might improve over the next month as her pregnancy progresses. I advised that if we need to we can switch to the pormethazine rectal suppositories, pt states she is a little better and wants to wait and see if the nausea resolves. I advised she RTC after delivery of her infant. Until then the OB/GYN will be caring for her through the pregnancy.    Pregnant and not yet delivered in first trimester    Return in about 1 year (around 12/12/2023) for annual physical exam.   Karie Georges, MD

## 2023-02-11 ENCOUNTER — Encounter: Payer: Self-pay | Admitting: Family Medicine

## 2023-03-06 ENCOUNTER — Ambulatory Visit (INDEPENDENT_AMBULATORY_CARE_PROVIDER_SITE_OTHER): Payer: BLUE CROSS/BLUE SHIELD | Admitting: Family Medicine

## 2023-03-06 ENCOUNTER — Encounter: Payer: Self-pay | Admitting: Family Medicine

## 2023-03-06 VITALS — BP 98/50 | HR 72 | Temp 98.5°F | Ht 63.0 in | Wt 154.4 lb

## 2023-03-06 DIAGNOSIS — Z0000 Encounter for general adult medical examination without abnormal findings: Secondary | ICD-10-CM

## 2023-03-06 NOTE — Patient Instructions (Signed)

## 2023-03-06 NOTE — Progress Notes (Signed)
Complete physical exam  Patient: Christine Rose   DOB: [birth year]   31 y.o. Female  MRN: [id number]  Subjective:    Chief Complaint  Patient presents with   Annual Exam    Christine Rose is a 32 y.o. female who presents today for a complete physical exam. She reports consuming a general diet. The patient has a physically strenuous job, but has no regular exercise apart from work.  She generally feels well. She reports sleeping relatively well, states that she wakes up often, sometimes due to her pregnancy. She does not have additional problems to discuss today.    Most recent fall risk assessment:     No data to display           Most recent depression screenings:    03/06/2023    2:05 PM 12/12/2022   10:58 AM  PHQ 2/9 Scores  PHQ - 2 Score 0 0  PHQ- 9 Score 0     Vision:Not within last year  and Dental: Current dental problems and No regular dental care   Patient Active Problem List   Diagnosis Date Noted   Nausea and vomiting during pregnancy 12/12/2022   Pregnant and not yet delivered in first trimester 12/12/2022   Full-term premature rupture of membranes 09/20/2020   PROM (premature rupture of membranes) 07/06/2018   Miscarriage 09/27/2017   Right upper quadrant pain 05/20/2014   Acute right lower quadrant pain 05/20/2014   Other specified fever 05/20/2014   Abnormal Pap smear 07/21/2012   ASCUS (atypical squamous cells of undetermined significance) on Pap smear 01/08/2012   Routine gynecological examination 12/27/2011   Surveillance for medroxyprogesterone contraception 12/27/2011   Contraceptive management 03/04/2011   Encounter for surveillance of injectable contraceptive 03/04/2011   Allergic rhinitis    Screening for STD (sexually transmitted disease) 12/26/2010   Preventative health care 12/26/2010   CHEST PAIN, ACUTE 05/25/2010   ALLERGIC RHINITIS 12/23/2009   GERD 12/23/2009   HAND PAIN, LEFT 02/28/2009   BACK PAIN 06/22/2008    UNSPECIFIED DISORDER OF SKIN&SUBCUTANEOUS TISSUE 06/29/2007   EXCESSIVE MENSTRUATION 06/04/2007      Patient Care Team: Karie Georges, MD as PCP - General (Family Medicine) Marny Lowenstein, PA-C (Obstetrics and Gynecology)   Outpatient Medications Prior to Visit  Medication Sig   [DISCONTINUED] doxylamine, Sleep, (UNISOM) 25 MG tablet Take 1 tablet (25 mg total) by mouth at bedtime as needed (nausea). Take along with pyridoxine   [DISCONTINUED] glycopyrrolate (ROBINUL) 1 MG tablet Take 1 tablet (1 mg total) by mouth every 8 (eight) hours as needed (excessive saliva).   [DISCONTINUED] ondansetron (ZOFRAN-ODT) 4 MG disintegrating tablet Take 1 tablet (4 mg total) by mouth every 6 (six) hours as needed for nausea.   No facility-administered medications prior to visit.    Review of Systems  HENT:  Negative for hearing loss.   Eyes:  Negative for blurred vision.  Respiratory:  Negative for shortness of breath.   Cardiovascular:  Negative for chest pain.  Gastrointestinal: Negative.   Genitourinary: Negative.   Musculoskeletal:  Negative for back pain.  Neurological:  Negative for headaches.  Psychiatric/Behavioral:  Negative for depression.   All other systems reviewed and are negative.      Objective:     BP (!) 98/50 (BP Location: Left Arm, Patient Position: Sitting, Cuff Size: Normal)   Pulse 72   Temp 98.5 F (36.9 C) (Oral)   Ht 5\' 3"  (1.6 m)   Wt 154 lb  6.4 oz (70 kg)   LMP 09/27/2022   SpO2 100%   BMI 27.35 kg/m    Physical Exam Vitals reviewed.  Constitutional:      Appearance: Normal appearance. She is well-groomed and normal weight.  HENT:     Right Ear: Tympanic membrane and ear canal normal.     Left Ear: Tympanic membrane and ear canal normal.     Mouth/Throat:     Mouth: Mucous membranes are moist.     Pharynx: No posterior oropharyngeal erythema.  Eyes:     Conjunctiva/sclera: Conjunctivae normal.  Neck:     Thyroid: No thyromegaly.   Cardiovascular:     Rate and Rhythm: Normal rate and regular rhythm.     Pulses: Normal pulses.     Heart sounds: S1 normal and S2 normal.  Pulmonary:     Effort: Pulmonary effort is normal.     Breath sounds: Normal breath sounds and air entry.  Abdominal:     General: Bowel sounds are normal.     Palpations: Abdomen is soft.     Comments: Gravid uterus  Musculoskeletal:     Right lower leg: No edema.     Left lower leg: No edema.  Lymphadenopathy:     Cervical: No cervical adenopathy.  Neurological:     Mental Status: She is alert and oriented to person, place, and time. Mental status is at baseline.     Gait: Gait is intact.  Psychiatric:        Mood and Affect: Mood and affect normal.        Speech: Speech normal.        Behavior: Behavior normal.        Judgment: Judgment normal.      No results found for any visits on 03/06/23.     Assessment & Plan:    Routine Health Maintenance and Physical Exam  Immunization History  Administered Date(s) Administered   Adenovirus 12/29/2015   Hepatitis A, Adult 12/29/2015   IPV 12/29/2015   Influenza Inj Mdck Quad With Preservative 08/13/2019   Influenza, Seasonal, Injecte, Preservative Fre 12/29/2015   Meningococcal Conjugate 12/29/2015   Td 05/22/2006   Tdap 12/29/2015    Health Maintenance  Topic Date Due   PAP SMEAR-Modifier  01/27/2022   COVID-19 Vaccine (1 - 2023-24 season) 03/22/2023 (Originally 04/12/2022)   INFLUENZA VACCINE  03/13/2023   DTaP/Tdap/Td (3 - Td or Tdap) 12/28/2025   Hepatitis C Screening  Completed   HIV Screening  Completed   HPV VACCINES  Aged Out    Discussed health benefits of physical activity, and encouraged her to engage in regular exercise appropriate for her age and condition.  Routine general medical examination at a health care facility  Normal physical exam findings today, pt is in her 2nd trimester of pregnancy but otherwise there are no significant findings. I filled out her  forms for her employment. She will need to return to office next week to have a PPD placed -- this is permitted during pregnancy. She will follow up with me 2 months after she delivers. Handouts given on healthy eating and exercise.   Return in 6 months (on 09/06/2023).     Karie Georges, MD

## 2023-03-10 ENCOUNTER — Ambulatory Visit (INDEPENDENT_AMBULATORY_CARE_PROVIDER_SITE_OTHER): Payer: BLUE CROSS/BLUE SHIELD | Admitting: *Deleted

## 2023-03-10 DIAGNOSIS — Z111 Encounter for screening for respiratory tuberculosis: Secondary | ICD-10-CM | POA: Diagnosis not present

## 2023-03-12 ENCOUNTER — Ambulatory Visit (INDEPENDENT_AMBULATORY_CARE_PROVIDER_SITE_OTHER): Payer: BLUE CROSS/BLUE SHIELD | Admitting: *Deleted

## 2023-03-12 DIAGNOSIS — Z111 Encounter for screening for respiratory tuberculosis: Secondary | ICD-10-CM | POA: Diagnosis not present

## 2023-03-12 NOTE — Progress Notes (Signed)
PPD Reading Note  PPD read and results entered in Epic[employer].  Result: 0 mm induration.  Interpretation: negative  If test not read within 48-72 hours of initial placement, patient advised to repeat in other arm 1-3 weeks after this test.  Allergic reaction: no

## 2023-03-25 ENCOUNTER — Encounter: Payer: Self-pay | Admitting: Family Medicine

## 2023-03-25 NOTE — Telephone Encounter (Signed)
Ok to print documents for both

## 2023-03-26 NOTE — Telephone Encounter (Signed)
PCP reviewed the documents and stated the patient can have a Quantiferon blood test or another PPD and asked that a copy of the immunizations be given also.  Patient walked in and was informed of the information above.  She stated she just had another PPD placed by [facility] since she had not heard from anyone here. Copy of the immunization report was given with our stamp.
